# Patient Record
Sex: Male | Born: 1955
Health system: Southern US, Community
[De-identification: ages and names within clinical notes are randomized; demographics above are authoritative.]

## PROBLEM LIST (undated history)

## (undated) DIAGNOSIS — M1990 Unspecified osteoarthritis, unspecified site: Secondary | ICD-10-CM

## (undated) DIAGNOSIS — S8990XA Unspecified injury of unspecified lower leg, initial encounter: Secondary | ICD-10-CM

## (undated) DIAGNOSIS — C61 Malignant neoplasm of prostate: Secondary | ICD-10-CM

## (undated) DIAGNOSIS — I10 Essential (primary) hypertension: Secondary | ICD-10-CM

## (undated) DIAGNOSIS — D649 Anemia, unspecified: Secondary | ICD-10-CM

## (undated) DIAGNOSIS — N400 Enlarged prostate without lower urinary tract symptoms: Secondary | ICD-10-CM

## (undated) DIAGNOSIS — M549 Dorsalgia, unspecified: Secondary | ICD-10-CM

## (undated) DIAGNOSIS — T7840XA Allergy, unspecified, initial encounter: Secondary | ICD-10-CM

## (undated) HISTORY — DX: Allergy, unspecified, initial encounter: T78.40XA

## (undated) HISTORY — PX: PROSTATE BIOPSY: SHX241

## (undated) HISTORY — PX: NO PAST SURGERIES: SHX2092

## (undated) HISTORY — DX: Unspecified osteoarthritis, unspecified site: M19.90

## (undated) HISTORY — DX: Anemia, unspecified: D64.9

---

## 1999-01-08 ENCOUNTER — Emergency Department (HOSPITAL_COMMUNITY): Admission: EM | Admit: 1999-01-08 | Discharge: 1999-01-08 | Payer: Self-pay | Admitting: Emergency Medicine

## 2007-09-15 ENCOUNTER — Emergency Department (HOSPITAL_COMMUNITY): Admission: EM | Admit: 2007-09-15 | Discharge: 2007-09-16 | Payer: Self-pay | Admitting: Emergency Medicine

## 2007-09-21 ENCOUNTER — Emergency Department (HOSPITAL_COMMUNITY): Admission: EM | Admit: 2007-09-21 | Discharge: 2007-09-21 | Payer: Self-pay | Admitting: Emergency Medicine

## 2007-09-24 ENCOUNTER — Ambulatory Visit: Payer: Self-pay | Admitting: Nurse Practitioner

## 2007-09-24 ENCOUNTER — Ambulatory Visit: Payer: Self-pay | Admitting: *Deleted

## 2007-09-24 LAB — CONVERTED CEMR LAB
Benzodiazepines.: NEGATIVE
Cocaine Metabolites: NEGATIVE
Methadone: NEGATIVE
Opiate Screen, Urine: NEGATIVE
Propoxyphene: NEGATIVE

## 2007-10-21 ENCOUNTER — Ambulatory Visit: Payer: Self-pay | Admitting: Internal Medicine

## 2008-02-16 ENCOUNTER — Ambulatory Visit: Payer: Self-pay | Admitting: Internal Medicine

## 2008-07-20 ENCOUNTER — Ambulatory Visit: Payer: Self-pay | Admitting: Internal Medicine
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
## 2013-01-07 ENCOUNTER — Encounter (HOSPITAL_COMMUNITY): Payer: Self-pay | Admitting: Emergency Medicine

## 2013-01-07 ENCOUNTER — Emergency Department (HOSPITAL_COMMUNITY)
Admission: EM | Admit: 2013-01-07 | Discharge: 2013-01-07 | Disposition: A | Discharge status: Home Health | Payer: Medicare Other | Attending: Emergency Medicine | Admitting: Emergency Medicine

## 2013-01-07 DIAGNOSIS — Z87828 Personal history of other (healed) physical injury and trauma: Secondary | ICD-10-CM | POA: Insufficient documentation

## 2013-01-07 DIAGNOSIS — S0010XA Contusion of unspecified eyelid and periocular area, initial encounter: Secondary | ICD-10-CM | POA: Insufficient documentation

## 2013-01-07 DIAGNOSIS — R220 Localized swelling, mass and lump, head: Secondary | ICD-10-CM | POA: Insufficient documentation

## 2013-01-07 DIAGNOSIS — Y9389 Activity, other specified: Secondary | ICD-10-CM | POA: Insufficient documentation

## 2013-01-07 DIAGNOSIS — H5789 Other specified disorders of eye and adnexa: Secondary | ICD-10-CM | POA: Insufficient documentation

## 2013-01-07 DIAGNOSIS — X58XXXA Exposure to other specified factors, initial encounter: Secondary | ICD-10-CM | POA: Insufficient documentation

## 2013-01-07 DIAGNOSIS — H02849 Edema of unspecified eye, unspecified eyelid: Secondary | ICD-10-CM | POA: Insufficient documentation

## 2013-01-07 DIAGNOSIS — Y929 Unspecified place or not applicable: Secondary | ICD-10-CM | POA: Insufficient documentation

## 2013-01-07 HISTORY — DX: Unspecified injury of unspecified lower leg, initial encounter: S89.90XA

## 2013-01-07 MED ORDER — CIPROFLOXACIN HCL 0.3 % OP SOLN: 2.0000 [drp] | Freq: Once | OPHTHALMIC | Status: DC

## 2013-01-07 MED ORDER — CEPHALEXIN 500 MG PO CAPS: 500.0000 mg | ORAL_CAPSULE | Freq: Four times a day (QID) | ORAL | Status: DC

## 2013-01-07 MED ORDER — TETRACAINE HCL 0.5 % OP SOLN
2.0000 [drp] | Freq: Once | OPHTHALMIC | Status: DC
  Filled 2013-01-07: qty 2

## 2013-01-07 MED ORDER — FLUORESCEIN SODIUM 1 MG OP STRP
1.0000 | ORAL_STRIP | Freq: Once | OPHTHALMIC | Status: DC
  Filled 2013-01-07 (×4): qty 1

## 2013-01-07 NOTE — ED Provider Notes (Addendum)
57 year old male who states that he has had eye pain and swelling for the last 2 days after accidentally getting a hair dye into his eye. The swelling is persistent, left eye greater than right eye, no change in vision, no pain with extraocular movements, no eye pain complaints at all. His visual acuity is normal in the right eye but he has been unable to see out of the left eye because of swelling.  On exam the patient has normal appearing pupils bilaterally, conjunctival injection bilaterally, no obvious corneal abrasion to the right eye under fluorescein and tetracaine exam on slit lamp, left eye difficult to visualize secondary to significant lid edema and swelling. There is no redness to the lids, no tenderness to the lids, when the lids are retracted a very small amount of the conjunctiva and iris and pupil is seen which appears mildly inflamed.  The patient will require definitive ophthalmology followup, will treat with irrigation, check pH, abx drops and optho f/u - PA Mathis Fare will call to arrange.  Will treat for preseptal cellulitis though at this time pt has no redness or tenderness of lids, no fever and no pain with EOM.   Medical screening examination/treatment/procedure(s) were conducted as a shared visit with non-physician practitioner(s) and myself.  I personally evaluated the patient during the encounter    Vida Roller, MD 01/07/13 [id number]  Vida Roller, MD 01/08/13 0400
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
## 2013-01-07 NOTE — ED Notes (Signed)
Pt. Stated, I used some hair dye and I was washing it out and I think I got some of the stuff in my eye and I've washed it out with water and this am my eye was shut completely from swelling.

## 2013-01-08 NOTE — ED Provider Notes (Signed)
Medical screening examination/treatment/procedure(s) were conducted as a shared visit with non-physician practitioner(s) and myself.  I personally evaluated the patient during the encounter  Please see my separate respective documentation pertaining to this patient encounter   Vida Roller, MD 01/08/13 0400

## 2014-05-10 ENCOUNTER — Other Ambulatory Visit (HOSPITAL_COMMUNITY): Payer: Self-pay | Admitting: Urology

## 2014-05-10 DIAGNOSIS — C61 Malignant neoplasm of prostate: Secondary | ICD-10-CM

## 2014-05-24 ENCOUNTER — Encounter (HOSPITAL_COMMUNITY): Payer: Medicare Other

## 2014-05-24 ENCOUNTER — Ambulatory Visit (HOSPITAL_COMMUNITY)
Admission: RE | Admit: 2014-05-24 | Discharge: 2014-05-24 | Disposition: A | Discharge status: Home / Self Care | Payer: Medicare Other | Source: Ambulatory Visit | Attending: Urology | Admitting: Urology

## 2014-05-24 DIAGNOSIS — M47812 Spondylosis without myelopathy or radiculopathy, cervical region: Secondary | ICD-10-CM | POA: Diagnosis not present

## 2014-05-24 DIAGNOSIS — M25869 Other specified joint disorders, unspecified knee: Secondary | ICD-10-CM | POA: Diagnosis not present

## 2014-05-24 DIAGNOSIS — M25819 Other specified joint disorders, unspecified shoulder: Secondary | ICD-10-CM | POA: Insufficient documentation

## 2014-05-24 DIAGNOSIS — M545 Low back pain, unspecified: Secondary | ICD-10-CM | POA: Diagnosis not present

## 2014-05-24 DIAGNOSIS — C61 Malignant neoplasm of prostate: Secondary | ICD-10-CM | POA: Diagnosis present

## 2014-05-24 DIAGNOSIS — G8929 Other chronic pain: Secondary | ICD-10-CM | POA: Insufficient documentation

## 2014-05-24 DIAGNOSIS — M25569 Pain in unspecified knee: Secondary | ICD-10-CM | POA: Diagnosis not present

## 2014-05-24 MED ORDER — TECHNETIUM TC 99M MEDRONATE IV KIT
26.0000 | PACK | Freq: Once | INTRAVENOUS | Status: AC | PRN
  Administered 2014-05-24: 26 via INTRAVENOUS

## 2014-06-08 ENCOUNTER — Other Ambulatory Visit (HOSPITAL_COMMUNITY): Payer: Self-pay | Admitting: Urology

## 2014-06-08 DIAGNOSIS — R599 Enlarged lymph nodes, unspecified: Secondary | ICD-10-CM

## 2014-06-23 ENCOUNTER — Encounter (HOSPITAL_COMMUNITY): Payer: Self-pay | Admitting: Pharmacy Technician

## 2014-06-27 ENCOUNTER — Other Ambulatory Visit: Payer: Self-pay | Admitting: Radiology

## 2014-06-28 ENCOUNTER — Encounter (HOSPITAL_COMMUNITY): Payer: Self-pay

## 2014-06-28 ENCOUNTER — Ambulatory Visit (HOSPITAL_COMMUNITY)
Admission: RE | Admit: 2014-06-28 | Discharge: 2014-06-28 | Disposition: A | Discharge status: Home / Self Care | Payer: Medicare Other | Source: Ambulatory Visit | Attending: Urology | Admitting: Urology

## 2014-06-28 ENCOUNTER — Other Ambulatory Visit (HOSPITAL_COMMUNITY): Payer: Self-pay | Admitting: Urology

## 2014-06-28 DIAGNOSIS — R599 Enlarged lymph nodes, unspecified: Secondary | ICD-10-CM | POA: Diagnosis not present

## 2014-06-28 HISTORY — DX: Essential (primary) hypertension: I10

## 2014-06-28 HISTORY — DX: Dorsalgia, unspecified: M54.9

## 2014-06-28 HISTORY — DX: Benign prostatic hyperplasia without lower urinary tract symptoms: N40.0

## 2014-06-28 HISTORY — DX: Malignant neoplasm of prostate: C61

## 2014-06-28 LAB — CBC
HCT: 45.4 % (ref 39.0–52.0)
Hemoglobin: 15.5 g/dL (ref 13.0–17.0)
MCH: 30.3 pg (ref 26.0–34.0)
MCHC: 34.1 g/dL (ref 30.0–36.0)
MCV: 88.7 fL (ref 78.0–100.0)
PLATELETS: 215 10*3/uL (ref 150–400)
RBC: 5.12 MIL/uL (ref 4.22–5.81)
RDW: 12.1 % (ref 11.5–15.5)
WBC: 5.2 10*3/uL (ref 4.0–10.5)

## 2014-06-28 LAB — PROTIME-INR
INR: 0.99 (ref 0.00–1.49)
PROTHROMBIN TIME: 13.1 s (ref 11.6–15.2)

## 2014-06-28 LAB — APTT: APTT: 29 s (ref 24–37)

## 2014-06-28 MED ORDER — MIDAZOLAM HCL 2 MG/2ML IJ SOLN
INTRAMUSCULAR | Status: AC
  Filled 2014-06-28: qty 6

## 2014-06-28 MED ORDER — FENTANYL CITRATE 0.05 MG/ML IJ SOLN
INTRAMUSCULAR | Status: AC
  Filled 2014-06-28: qty 6

## 2014-06-28 MED ORDER — SODIUM CHLORIDE 0.9 % IV SOLN
Freq: Once | INTRAVENOUS | Status: AC
  Administered 2014-06-28: 12:00:00 via INTRAVENOUS

## 2014-06-28 NOTE — Progress Notes (Signed)
Pt returned from ultrasound and Teena Dunk states the doctor stated there was nothing to biopsy and procedure was canceled.  Pt did not receive any IV sedation or any drugs via IV and the doctor states to d/c his IV and send him home. Misty informed pt that he should be hearing from Dr.Nesi for further followup.  Pt voiced understanding.

## 2014-06-28 NOTE — Progress Notes (Signed)
Pt iv site d/c, pressure dressing applied.  Pt vss, afebrile.  No sedation was given.  Pt d/c home ambulatory with friend.

## 2014-06-28 NOTE — H&P (Signed)
Chief Complaint: "I'm here for a biopsy" Referring Physician:Nesi HPI: Terry Hinton is an 58 y.o. male with recently diagnosed prostate cancer. He is found to have pelvic and inguinal lymphadenopathy on recent CT and is referred for US guided biopsy. PMHx, meds, labs, imaging reviewed.  Past Medical History:  Past Medical History  Diagnosis Date  . Leg injury   . Hypertension   . Back pain     back, neck and leg problems, pt. stated  . Enlarged prostate     pt. stated  . Prostate cancer     Past Surgical History:  Past Surgical History  Procedure Laterality Date  . No past surgeries      Family History: History reviewed. No pertinent family history.  Social History:  reports that he quit smoking about 7 weeks ago. He does not have any smokeless tobacco history on file. He reports that he drinks alcohol. He reports that he does not use illicit drugs.  Allergies: No Known Allergies  Medications:   Medication List    ASK your doctor about these medications       lisinopril-hydrochlorothiazide 20-12.5 MG per tablet  Commonly known as:  PRINZIDE,ZESTORETIC  Take 1 tablet by mouth every morning.     methocarbamol 500 MG tablet  Commonly known as:  ROBAXIN  Take 500 mg by mouth every 6 (six) hours as needed for muscle spasms.     tamsulosin 0.4 MG Caps capsule  Commonly known as:  FLOMAX  Take 0.4 mg by mouth at bedtime.     traMADol 50 MG tablet  Commonly known as:  ULTRAM  Take 50 mg by mouth every 6 (six) hours as needed (Pain).        Please HPI for pertinent positives, otherwise complete 10 system ROS negative.  Physical Exam: BP 136/89  Pulse 83  Temp(Src) 98.3 F (36.8 C) (Oral)  Resp 20  Ht 6\' 8"  (2.032 m)  Wt 210 lb (95.255 kg)  BMI 23.07 kg/m2  SpO2 98% Body mass index is 23.07 kg/(m^2).   General Appearance:  Alert, cooperative, no distress, appears stated age  Head:  Normocephalic, without obvious abnormality, atraumatic  ENT: Unremarkable   Neck: Supple, symmetrical, trachea midline  Lungs:   Clear to auscultation bilaterally, no w/r/r, respirations unlabored without use of accessory muscles.  Heart:  Regular rate and rhythm, S1, S2 normal, no murmur, rub or gallop.  Abdomen:   Soft, non-tender, non distended. Could not palpate any inguinal adenopathy  Neurologic: Normal affect, no gross deficits.   Results for orders placed during the hospital encounter of 06/28/14 (from the past 48 hour(s))  APTT     Status: None   Collection Time    06/28/14 11:10 AM      Result Value Ref Range   aPTT 29  24 - 37 seconds  CBC     Status: None   Collection Time    06/28/14 11:10 AM      Result Value Ref Range   WBC 5.2  4.0 - 10.5 K/uL   RBC 5.12  4.22 - 5.81 MIL/uL   Hemoglobin 15.5  13.0 - 17.0 g/dL   HCT 45.4  39.0 - 52.0 %   MCV 88.7  78.0 - 100.0 fL   MCH 30.3  26.0 - 34.0 pg   MCHC 34.1  30.0 - 36.0 g/dL   RDW 12.1  11.5 - 15.5 %   Platelets 215  150 - 400 K/uL  PROTIME-INR  Status: None   Collection Time    06/28/14 11:10 AM      Result Value Ref Range   Prothrombin Time 13.1  11.6 - 15.2 seconds   INR 0.99  0.00 - 1.49   No results found.  Assessment/Plan Prostate cancer with pelvic and inguinal lymphadenopathy by CT For US guided biopsy of (L)inguinal LN Explained procedure, risks, complications, use of sedation Labs reviewed. Consent signed in chart  Ascencion Dike PA-C 06/28/2014, 12:33 PM

## 2014-07-07 ENCOUNTER — Telehealth: Payer: Self-pay | Admitting: *Deleted

## 2014-07-07 NOTE — Telephone Encounter (Signed)
Called patient re: his referral to the 07/15/14 Prostate [facility].  He stated "I'm good, don't need it, I won't be coming."  I asked if his MD was aware of his decision.  I believe he said Dr. Janice Norrie was his physician but before I could confirm, he hung up.  I am notifying Dr. Alinda Money of patient's decision.  Gayleen Orem, RN, BSN, [facility] [place] [phone]

## 2014-07-15 ENCOUNTER — Ambulatory Visit: Admission: RE | Admit: 2014-07-15 | Payer: Medicare Other | Source: Ambulatory Visit | Admitting: Radiation Oncology

## 2016-01-11 ENCOUNTER — Other Ambulatory Visit: Payer: Self-pay | Admitting: Urology

## 2016-01-11 DIAGNOSIS — C61 Malignant neoplasm of prostate: Secondary | ICD-10-CM

## 2016-02-01 ENCOUNTER — Ambulatory Visit (HOSPITAL_COMMUNITY)
Admission: RE | Admit: 2016-02-01 | Discharge: 2016-02-01 | Disposition: A | Discharge status: Home / Self Care | Payer: Medicare Other | Source: Ambulatory Visit | Attending: Urology | Admitting: Urology

## 2016-02-01 ENCOUNTER — Encounter (HOSPITAL_COMMUNITY)
Admission: RE | Admit: 2016-02-01 | Discharge: 2016-02-01 | Disposition: A | Discharge status: Home / Self Care | Payer: Medicare Other | Source: Ambulatory Visit | Attending: Urology | Admitting: Urology

## 2016-02-01 DIAGNOSIS — C61 Malignant neoplasm of prostate: Secondary | ICD-10-CM | POA: Diagnosis not present

## 2016-02-01 MED ORDER — TECHNETIUM TC 99M MEDRONATE IV KIT
25.8000 | PACK | Freq: Once | INTRAVENOUS | Status: AC | PRN
  Administered 2016-02-01: 25.8 via INTRAVENOUS

## 2016-03-19 ENCOUNTER — Encounter: Payer: Self-pay | Admitting: Radiation Oncology

## 2016-03-19 NOTE — Progress Notes (Signed)
GU Location of Tumor / Histology: prostatic adenocarcinoma  If Prostate Cancer, Gleason Score is (4 + 5) and PSA is (1214) as of 01/11/16  Terry Hinton was last seen by Dr. Alyson Ingles in August 2015 and at that time his PSA was 474.   Biopsies of prostate (if applicable) revealed:    Past/Anticipated interventions by urology, if any: Firmagon 120 mg x 2 given on 03/18/16 plus referral to radiation oncology for condieration of IMRT. Per McKenzie's note the patient will require ADT for 2-3 years.  Past/Anticipated interventions by medical oncology, if any: no  Weight changes, if any: no  Bowel/Bladder complaints, if any: urinary frequency, urgency and nocturia x 2-3   Nausea/Vomiting, if any: no  Pain issues, if any:  no  SAFETY ISSUES:  Prior radiation? no  Pacemaker/ICD? no  Possible current pregnancy? no  Is the patient on methotrexate? no  Current Complaints / other details:  60 year old male. Single. CT abd/pelvis revealed retroperitoneal lymph nodes. Bone scan normal.

## 2016-03-21 ENCOUNTER — Ambulatory Visit
Admission: RE | Admit: 2016-03-21 | Discharge: 2016-03-21 | Disposition: A | Discharge status: Home / Self Care | Payer: Medicare Other | Source: Ambulatory Visit | Attending: Radiation Oncology | Admitting: Radiation Oncology

## 2016-03-21 ENCOUNTER — Ambulatory Visit: Payer: Medicare Other

## 2016-03-22 ENCOUNTER — Ambulatory Visit: Payer: Medicare Other

## 2016-03-22 ENCOUNTER — Ambulatory Visit: Payer: Medicare Other | Admitting: Radiation Oncology

## 2016-03-22 ENCOUNTER — Inpatient Hospital Stay: Admission: RE | Admit: 2016-03-22 | Payer: Medicare Other | Source: Ambulatory Visit

## 2016-05-01 ENCOUNTER — Telehealth: Payer: Self-pay | Admitting: Medical Oncology

## 2016-05-01 NOTE — Telephone Encounter (Signed)
I called Terry Hinton to inquire about the cancelled appointments with Dr. Tammi Klippel. He cancelled  his appointment 5/18 and  rescheduled for  5/19 and again he cancelled. He states that he took the hormone shot and it made him so fatigued he stopped. I explained that he has some enlarged lymph nodes that need to be treated. I stressed to him the importance of getting the radiation to  keep the cancer from spreading. I offered to transfer him to [place]  to reschedule but he declined and stated that he would call me back. I will notify Sam, Dr. Johny Shears nurse.

## 2016-09-24 ENCOUNTER — Encounter: Payer: Self-pay | Admitting: Gastroenterology

## 2016-11-13 ENCOUNTER — Encounter: Payer: Self-pay | Admitting: Internal Medicine

## 2016-11-28 ENCOUNTER — Encounter: Payer: Medicare Other | Admitting: Gastroenterology

## 2017-12-11 ENCOUNTER — Encounter: Payer: Self-pay | Admitting: Internal Medicine

## 2017-12-17 ENCOUNTER — Other Ambulatory Visit: Payer: Self-pay | Admitting: Urology

## 2017-12-17 ENCOUNTER — Emergency Department (HOSPITAL_COMMUNITY)
Admission: EM | Admit: 2017-12-17 | Discharge: 2017-12-17 | Disposition: A | Discharge status: Home / Self Care | Payer: Medicare Other | Attending: Emergency Medicine | Admitting: Emergency Medicine

## 2017-12-17 ENCOUNTER — Other Ambulatory Visit: Payer: Self-pay

## 2017-12-17 ENCOUNTER — Encounter (HOSPITAL_COMMUNITY): Payer: Self-pay | Admitting: Emergency Medicine

## 2017-12-17 DIAGNOSIS — R110 Nausea: Secondary | ICD-10-CM | POA: Insufficient documentation

## 2017-12-17 DIAGNOSIS — Z87891 Personal history of nicotine dependence: Secondary | ICD-10-CM | POA: Diagnosis not present

## 2017-12-17 DIAGNOSIS — R339 Retention of urine, unspecified: Secondary | ICD-10-CM | POA: Insufficient documentation

## 2017-12-17 DIAGNOSIS — R1030 Lower abdominal pain, unspecified: Secondary | ICD-10-CM | POA: Diagnosis present

## 2017-12-17 DIAGNOSIS — I10 Essential (primary) hypertension: Secondary | ICD-10-CM | POA: Insufficient documentation

## 2017-12-17 DIAGNOSIS — Z79899 Other long term (current) drug therapy: Secondary | ICD-10-CM | POA: Diagnosis not present

## 2017-12-17 DIAGNOSIS — Z8546 Personal history of malignant neoplasm of prostate: Secondary | ICD-10-CM | POA: Diagnosis not present

## 2017-12-17 DIAGNOSIS — R338 Other retention of urine: Secondary | ICD-10-CM

## 2017-12-17 DIAGNOSIS — C61 Malignant neoplasm of prostate: Secondary | ICD-10-CM

## 2017-12-17 LAB — CBC WITH DIFFERENTIAL/PLATELET
Basophils Absolute: 0 10*3/uL (ref 0.0–0.1)
Basophils Relative: 0 %
EOS ABS: 0.2 10*3/uL (ref 0.0–0.7)
Eosinophils Relative: 2 %
HCT: 42.9 % (ref 39.0–52.0)
HEMOGLOBIN: 14.4 g/dL (ref 13.0–17.0)
Lymphocytes Relative: 11 %
Lymphs Abs: 0.8 10*3/uL (ref 0.7–4.0)
MCH: 30.7 pg (ref 26.0–34.0)
MCHC: 33.6 g/dL (ref 30.0–36.0)
MCV: 91.5 fL (ref 78.0–100.0)
Monocytes Absolute: 0.4 10*3/uL (ref 0.1–1.0)
Monocytes Relative: 5 %
NEUTROS PCT: 82 %
Neutro Abs: 5.9 10*3/uL (ref 1.7–7.7)
PLATELETS: 181 10*3/uL (ref 150–400)
RBC: 4.69 MIL/uL (ref 4.22–5.81)
RDW: 13.4 % (ref 11.5–15.5)
WBC: 7.3 10*3/uL (ref 4.0–10.5)

## 2017-12-17 LAB — BASIC METABOLIC PANEL
Anion gap: 14 (ref 5–15)
BUN: 14 mg/dL (ref 6–20)
CALCIUM: 9.4 mg/dL (ref 8.9–10.3)
CO2: 21 mmol/L — ABNORMAL LOW (ref 22–32)
CREATININE: 1.06 mg/dL (ref 0.61–1.24)
Chloride: 106 mmol/L (ref 101–111)
Glucose, Bld: 143 mg/dL — ABNORMAL HIGH (ref 65–99)
Potassium: 3.7 mmol/L (ref 3.5–5.1)
Sodium: 141 mmol/L (ref 135–145)

## 2017-12-17 LAB — URINALYSIS, ROUTINE W REFLEX MICROSCOPIC
Bacteria, UA: NONE SEEN
Bilirubin Urine: NEGATIVE
GLUCOSE, UA: NEGATIVE mg/dL
Ketones, ur: NEGATIVE mg/dL
Leukocytes, UA: NEGATIVE
NITRITE: NEGATIVE
Protein, ur: NEGATIVE mg/dL
SPECIFIC GRAVITY, URINE: 1.011 (ref 1.005–1.030)
Squamous Epithelial / LPF: NONE SEEN
pH: 6 (ref 5.0–8.0)

## 2017-12-17 NOTE — ED Triage Notes (Signed)
Pt arriving from home with complaints of abdominal pain in lower quadrants. Pt states he ate a burger for dinner and he feels that it just didn't agree with him. Pt states he feels bloated in the lower quadrants. Pt also having pain in his "backside". Pt denies hemorrhoids. Pt feeling nauseous but denies vomiting.  18g R hand 121mcg Fentanyl

## 2017-12-17 NOTE — ED Notes (Signed)
Bed: EV20 Expected date:  Expected time:  Means of arrival:  Comments: EMS 62 yo male with swelling to rectum/testicles

## 2017-12-17 NOTE — ED Notes (Signed)
Bladder scan shows pt bladder is full. Will in and out pt if necessary to relieve.

## 2017-12-17 NOTE — ED Provider Notes (Signed)
[facility] DEPT Provider Note: Georgena Spurling, MD, FACEP  CSN: [id number] MRN: [id number] ARRIVAL: 12/17/17 at [facility]: [place]  Urinary Retention   HISTORY OF PRESENT ILLNESS  12/17/17 3:48 AM Terry Hinton is a 62 y.o. male with a history of prostate cancer currently undergoing radiation treatment.  He is here with lower abdominal pain that began yesterday after eating dinner.  He describes the pain as crampy and moderate to severe.  It was associated with inability to urinate.  On arrival a bladder scan showed greater than 700 mL of retained urine in the bladder and a Foley catheter was placed by nursing staff.  He had significant relief of his symptoms.  He has had associated nausea but no vomiting.  He was given 100 mcg of fentanyl prior to arrival with partial relief of his pain.   Past Medical History:  Diagnosis Date  . Back pain    back, neck and leg problems, pt. stated  . Enlarged prostate    pt. stated  . Hypertension   . Leg injury   . Prostate cancer [facility])     Past Surgical History:  Procedure Laterality Date  . NO PAST SURGERIES    . PROSTATE BIOPSY      No family history on file.  Social History   Tobacco Use  . Smoking status: Former Smoker    Types: Cigarettes    Last attempt to quit: 05/08/2014    Years since quitting: 3.6  . Smokeless tobacco: Never Used  Substance Use Topics  . Alcohol use: Yes  . Drug use: No    Prior to Admission medications   Medication Sig Start Date End Date Taking? Authorizing Provider  cholecalciferol (VITAMIN D) 1000 units tablet Take 1,000 Units by mouth daily.   Yes [provider]  cyclobenzaprine (FLEXERIL) 5 MG tablet Take 5 mg by mouth 3 (three) times daily as needed for muscle spasms.   Yes [provider]  finasteride (PROSCAR) 5 MG tablet Take 5 mg by mouth daily.   Yes [provider]  latanoprost (XALATAN) 0.005 % ophthalmic solution Place 1 drop into both  eyes at bedtime.   Yes [provider]  OVER THE COUNTER MEDICATION Take 1 tablet by mouth daily.   Yes [provider]  traMADol (ULTRAM) 50 MG tablet Take 50 mg by mouth every 6 (six) hours as needed (Pain).   Yes [provider]    Allergies Patient has no known allergies.   REVIEW OF SYSTEMS  Negative except as noted here or in the History of Present Illness.   PHYSICAL EXAMINATION  Initial Vital Signs Blood pressure (!) 164/96, pulse 84, temperature 97.8 F (36.6 C), temperature source Oral, resp. rate 20, SpO2 93 %.  Examination General: Well-developed, well-nourished male in no acute distress; appearance consistent with age of record HENT: normocephalic; atraumatic Eyes: pupils equal, round and reactive to light; extraocular muscles intact Neck: supple Heart: regular rate and rhythm Lungs: clear to auscultation bilaterally Abdomen: soft; nondistended; nontender; no masses or hepatosplenomegaly; bowel sounds present GU: Foley catheter draining clear amber urine Extremities: No deformity; full range of motion; pulses normal Neurologic: Awake, alert and oriented; motor function intact in all extremities and symmetric; no facial droop Skin: Warm and dry Psychiatric: Normal mood and affect   RESULTS  Summary of this visit's results, reviewed by myself:   EKG Interpretation  Date/Time:    Ventricular Rate:    PR Interval:  QRS Duration:   QT Interval:    QTC Calculation:   R Axis:     Text Interpretation:        Laboratory Studies: Results for orders placed or performed during the hospital encounter of 12/17/17 (from the past 24 hour(s))  CBC with Differential     Status: None   Collection Time: 12/17/17  3:07 AM  Result Value Ref Range   WBC 7.3 4.0 - 10.5 K/uL   RBC 4.69 4.22 - 5.81 MIL/uL   Hemoglobin 14.4 13.0 - 17.0 g/dL   HCT 42.9 39.0 - 52.0 %   MCV 91.5 78.0 - 100.0 fL   MCH 30.7 26.0 - 34.0 pg   MCHC 33.6 30.0 - 36.0  g/dL   RDW 13.4 11.5 - 15.5 %   Platelets 181 150 - 400 K/uL   Neutrophils Relative % 82 %   Neutro Abs 5.9 1.7 - 7.7 K/uL   Lymphocytes Relative 11 %   Lymphs Abs 0.8 0.7 - 4.0 K/uL   Monocytes Relative 5 %   Monocytes Absolute 0.4 0.1 - 1.0 K/uL   Eosinophils Relative 2 %   Eosinophils Absolute 0.2 0.0 - 0.7 K/uL   Basophils Relative 0 %   Basophils Absolute 0.0 0.0 - 0.1 K/uL  Basic metabolic panel     Status: Abnormal   Collection Time: 12/17/17  3:07 AM  Result Value Ref Range   Sodium 141 135 - 145 mmol/L   Potassium 3.7 3.5 - 5.1 mmol/L   Chloride 106 101 - 111 mmol/L   CO2 21 (L) 22 - 32 mmol/L   Glucose, Bld 143 (H) 65 - 99 mg/dL   BUN 14 6 - 20 mg/dL   Creatinine, Ser 1.06 0.61 - 1.24 mg/dL   Calcium 9.4 8.9 - 10.3 mg/dL   GFR calc non Af Amer >60 >60 mL/min   GFR calc Af Amer >60 >60 mL/min   Anion gap 14 5 - 15  Urinalysis, Routine w reflex microscopic     Status: Abnormal   Collection Time: 12/17/17  3:49 AM  Result Value Ref Range   Color, Urine YELLOW YELLOW   APPearance CLEAR CLEAR   Specific Gravity, Urine 1.011 1.005 - 1.030   pH 6.0 5.0 - 8.0   Glucose, UA NEGATIVE NEGATIVE mg/dL   Hgb urine dipstick MODERATE (A) NEGATIVE   Bilirubin Urine NEGATIVE NEGATIVE   Ketones, ur NEGATIVE NEGATIVE mg/dL   Protein, ur NEGATIVE NEGATIVE mg/dL   Nitrite NEGATIVE NEGATIVE   Leukocytes, UA NEGATIVE NEGATIVE   RBC / HPF 6-30 0 - 5 RBC/hpf   WBC, UA 0-5 0 - 5 WBC/hpf   Bacteria, UA NONE SEEN NONE SEEN   Squamous Epithelial / LPF NONE SEEN NONE SEEN   Mucus PRESENT    Imaging Studies: No results found.  ED COURSE  Nursing notes and initial vitals signs, including pulse oximetry, reviewed.  Vitals:   12/17/17 0312 12/17/17 0322  BP: (!) 164/96   Pulse: 84   Resp: 20   Temp:  97.8 F (36.6 C)  TempSrc:  Oral  SpO2: 93%     PROCEDURES    ED DIAGNOSES     ICD-10-CM   1. Acute urinary retention R33.8        Shanon Rosser, MD 12/17/17 [phone]

## 2017-12-18 LAB — URINE CULTURE: Culture: NO GROWTH

## 2017-12-23 ENCOUNTER — Encounter: Payer: Self-pay | Admitting: Oncology

## 2017-12-25 ENCOUNTER — Encounter (HOSPITAL_COMMUNITY)
Admission: RE | Admit: 2017-12-25 | Discharge: 2017-12-25 | Disposition: A | Discharge status: Home / Self Care | Payer: Medicare Other | Source: Ambulatory Visit | Attending: Urology | Admitting: Urology

## 2017-12-25 DIAGNOSIS — C61 Malignant neoplasm of prostate: Secondary | ICD-10-CM | POA: Insufficient documentation

## 2017-12-25 MED ORDER — TECHNETIUM TC 99M MEDRONATE IV KIT
21.4000 | PACK | Freq: Once | INTRAVENOUS | Status: AC | PRN
  Administered 2017-12-25: 21.4 via INTRAVENOUS

## 2018-01-14 ENCOUNTER — Telehealth: Payer: Self-pay

## 2018-01-14 ENCOUNTER — Inpatient Hospital Stay: Payer: Medicare Other | Attending: Oncology | Admitting: Oncology

## 2018-01-14 ENCOUNTER — Inpatient Hospital Stay: Payer: Medicare Other

## 2018-01-14 VITALS — BP 137/66 | HR 92 | Temp 95.9°F | Resp 13 | Wt 275.0 lb

## 2018-01-14 DIAGNOSIS — Z87891 Personal history of nicotine dependence: Secondary | ICD-10-CM | POA: Diagnosis not present

## 2018-01-14 DIAGNOSIS — C7951 Secondary malignant neoplasm of bone: Secondary | ICD-10-CM | POA: Diagnosis not present

## 2018-01-14 DIAGNOSIS — C61 Malignant neoplasm of prostate: Secondary | ICD-10-CM

## 2018-01-14 LAB — CMP (CANCER CENTER ONLY)
ALK PHOS: 137 U/L (ref 40–150)
ALT: 34 U/L (ref 0–55)
AST: 20 U/L (ref 5–34)
Albumin: 3.9 g/dL (ref 3.5–5.0)
Anion gap: 8 (ref 3–11)
BILIRUBIN TOTAL: 0.2 mg/dL (ref 0.2–1.2)
BUN: 14 mg/dL (ref 7–26)
CO2: 27 mmol/L (ref 22–29)
CREATININE: 1.15 mg/dL (ref 0.70–1.30)
Calcium: 9.4 mg/dL (ref 8.4–10.4)
Chloride: 106 mmol/L (ref 98–109)
GFR, Est AFR Am: >60 mL/min (ref >60)
Glucose, Bld: 106 mg/dL (ref 70–140)
POTASSIUM: 3.8 mmol/L (ref 3.5–5.1)
Sodium: 141 mmol/L (ref 136–145)
TOTAL PROTEIN: 6.6 g/dL (ref 6.4–8.3)

## 2018-01-14 LAB — CBC WITH DIFFERENTIAL (CANCER CENTER ONLY)
Basophils Absolute: 0 10*3/uL (ref 0.0–0.1)
Basophils Relative: 1 %
Eosinophils Absolute: 0.4 10*3/uL (ref 0.0–0.5)
Eosinophils Relative: 6 %
HEMATOCRIT: 44 % (ref 38.4–49.9)
HEMOGLOBIN: 14.3 g/dL (ref 13.0–17.1)
LYMPHS ABS: 1.5 10*3/uL (ref 0.9–3.3)
Lymphocytes Relative: 25 %
MCH: 29.8 pg (ref 27.2–33.4)
MCHC: 32.4 g/dL (ref 32.0–36.0)
MCV: 92 fL (ref 79.3–98.0)
Monocytes Absolute: 0.4 10*3/uL (ref 0.1–0.9)
Monocytes Relative: 7 %
NEUTROS ABS: 3.8 10*3/uL (ref 1.5–6.5)
Neutrophils Relative %: 61 %
Platelet Count: 211 10*3/uL (ref 140–400)
RBC: 4.79 MIL/uL (ref 4.20–5.82)
RDW: 13.3 % (ref 11.0–14.6)
WBC Count: 6.1 10*3/uL (ref 4.0–10.3)

## 2018-01-14 MED ORDER — PREDNISONE 5 MG PO TABS: 5.0000 mg | ORAL_TABLET | Freq: Every day | ORAL | 3 refills | Status: AC

## 2018-01-14 MED ORDER — ABIRATERONE ACETATE 250 MG PO TABS: 1000.0000 mg | ORAL_TABLET | Freq: Every day | ORAL | 0 refills | Status: DC

## 2018-01-14 NOTE — Telephone Encounter (Signed)
Printed avs and calender of upcoming appointm,ent. 3/13

## 2018-01-14 NOTE — Telephone Encounter (Signed)
Printed avs and calender of upcoming appointment. Per 3/13 los Also, he Lab add on for today

## 2018-01-14 NOTE — Progress Notes (Signed)
Reason for Referral: Prostate cancer.   HPI: 62 year old gentleman currently of [place] where he lived the majority of his life. He is a gentleman with history of hypertension without any other comorbid conditions. He was found to have an elevated PSA in 2015 of 474. Prostate biopsy at that time showed Mrs. score 4+5 = 9 and 4 cores and 4+4 = 8 and the rest of the cores with high-volume disease. Staging workup at that time did not show any bony metastasis but he did have an enlarged left external iliac and left inguinal lymph nodes. At that time, he received Firmagon at 240 mg and was scheduled to start radiation therapy but canceled those appointments and felt fatigued on ADT and discontinued therapy at that time. He failed to follow-up since that time.  He was seen in the emergency department on 12/17/2017 and at that time he was having difficulties with voiding and obstructive symptoms. A catheter was placed at the time and was referred back to Dr. Alyson Ingles. He was started on Flomax and his PSA at that time was 2078. Bone scan obtained on 12/25/2017 showed new focus of abnormal uptake in the proximal left humeral shaft concerning for metastatic disease. CT scan of the abdomen and pelvis showed a large irregular prostate mass and appears to be invading into the bladder mass and causing bladder outlet obstruction and bladder wall thickening. No metastasis was noted in the external iliac and bilaterally. Based on these findings, he was started on Firmagon by Dr. Alyson Ingles. Since that time, he noticed improvement in his symptoms overall. He is able to urinate freely without any catheter at this time. He denies any hematuria or dysuria. He denied any abdominal pain or distention. He denied any difficulties with defecation. His performance status and quality of life is improved.  He does not report any headaches, blurry vision, syncope or seizures. Does not report any fevers, chills or sweats.  Does not  report any cough, wheezing or hemoptysis.  Does not report any chest pain, palpitation, orthopnea or leg edema.  Does not report any nausea, vomiting or abdominal pain.  Does not report any constipation or diarrhea.  Does not report any skeletal complaints.    Does not report frequency, urgency or hematuria.  Does not report any skin rashes or lesions. Does not report any heat or cold intolerance.  Does not report any lymphadenopathy or petechiae.  Does not report any anxiety or depression.  Remaining review of systems is negative.    Past Medical History:  Diagnosis Date  . Back pain    back, neck and leg problems, pt. stated  . Enlarged prostate    pt. stated  . Hypertension   . Leg injury   . Prostate cancer [facility])   :  Past Surgical History:  Procedure Laterality Date  . NO PAST SURGERIES    . PROSTATE BIOPSY    :   Current Outpatient Medications:  .  abiraterone acetate (ZYTIGA) 250 MG tablet, Take 4 tablets (1,000 mg total) by mouth daily. Take on an empty stomach 1 hour before or 2 hours after a meal, Disp: 120 tablet, Rfl: 0 .  cholecalciferol (VITAMIN D) 1000 units tablet, Take 1,000 Units by mouth daily., Disp: , Rfl:  .  cyclobenzaprine (FLEXERIL) 5 MG tablet, Take 5 mg by mouth 3 (three) times daily as needed for muscle spasms., Disp: , Rfl:  .  finasteride (PROSCAR) 5 MG tablet, Take 5 mg by mouth daily., Disp: , Rfl:  .  latanoprost (XALATAN) 0.005 % ophthalmic solution, Place 1 drop into both eyes at bedtime., Disp: , Rfl:  .  OVER THE COUNTER MEDICATION, Take 1 tablet by mouth daily., Disp: , Rfl:  .  predniSONE (DELTASONE) 5 MG tablet, Take 1 tablet (5 mg total) by mouth daily with breakfast., Disp: 90 tablet, Rfl: 3 .  traMADol (ULTRAM) 50 MG tablet, Take 50 mg by mouth every 6 (six) hours as needed (Pain)., Disp: , Rfl: :  No Known Allergies:  No family history on file.:  Social History   Socioeconomic History  . Marital status: Widowed    Spouse name: Not on  file  . Number of children: Not on file  . Years of education: Not on file  . Highest education level: Not on file  Social Needs  . Financial resource strain: Not on file  . Food insecurity - worry: Not on file  . Food insecurity - inability: Not on file  . Transportation needs - medical: Not on file  . Transportation needs - non-medical: Not on file  Occupational History  . Not on file  Tobacco Use  . Smoking status: Former Smoker    Types: Cigarettes    Last attempt to quit: 05/08/2014    Years since quitting: 3.6  . Smokeless tobacco: Never Used  Substance and Sexual Activity  . Alcohol use: Yes  . Drug use: No  . Sexual activity: Not Currently  Other Topics Concern  . Not on file  Social History Narrative  . Not on file  :    Exam: Blood pressure 137/66, pulse 92, temperature (!) 95.9 F (35.5 C), temperature source Oral, resp. rate 13, weight 275 lb (124.7 kg), SpO2 97 %.  ECOG 0 General appearance: alert and cooperative appeared without distress. Head: atraumatic without any abnormalities. Eyes: conjunctivae/corneas clear. PERRL.  Sclera anicteric. Throat: lips, mucosa, and tongue normal; without oral thrush or ulcers. Resp: clear to auscultation bilaterally without rhonchi, wheezes or dullness to percussion. Cardio: regular rate and rhythm, S1, S2 normal, no murmur, click, rub or gallop GI: soft, non-tender; bowel sounds normal; no masses,  no organomegaly Skin: Skin color, texture, turgor normal. No rashes or lesions Lymph nodes: Cervical, supraclavicular, and axillary nodes normal. Neurologic: Grossly normal without any motor, sensory or deep tendon reflexes. Musculoskeletal: No joint deformity or effusion.  CBC    Component Value Date/Time   WBC 7.3 12/17/2017 0307   RBC 4.69 12/17/2017 0307   HGB 14.4 12/17/2017 0307   HCT 42.9 12/17/2017 0307   PLT 181 12/17/2017 0307   MCV 91.5 12/17/2017 0307   MCH 30.7 12/17/2017 0307   MCHC 33.6 12/17/2017 0307    RDW 13.4 12/17/2017 0307   LYMPHSABS 0.8 12/17/2017 0307   MONOABS 0.4 12/17/2017 0307   EOSABS 0.2 12/17/2017 0307   BASOSABS 0.0 12/17/2017 0307     Chemistry      Component Value Date/Time   NA 141 12/17/2017 0307   K 3.7 12/17/2017 0307   CL 106 12/17/2017 0307   CO2 21 (L) 12/17/2017 0307   BUN 14 12/17/2017 0307   CREATININE 1.06 12/17/2017 0307      Component Value Date/Time   CALCIUM 9.4 12/17/2017 0307       Nm Bone Scan Whole Body  Result Date: 12/25/2017 CLINICAL DATA:  Current history of prostate cancer. EXAM: NUCLEAR MEDICINE WHOLE BODY BONE SCAN TECHNIQUE: Whole body anterior and posterior images were obtained approximately 3 hours after intravenous injection of radiopharmaceutical. RADIOPHARMACEUTICALS:  21.4 mCi Technetium-22m MDP IV COMPARISON:  Bone scans of February 01, 2016 and May 24, 2014. FINDINGS: New focus of abnormal uptake is seen involving the proximal left humeral shaft concerning for metastatic disease. Stable abnormal uptake is seen involving the left knee consistent with degenerative change. Abnormal uptake is seen involving both feet also consistent with degenerative change. Focus of abnormal uptake is seen to the right of lower cervical spine most consistent with degenerative change. Stable focus of abnormal uptake is seen involving midthoracic spine most consistent with degenerative change. IMPRESSION: New focus of abnormal uptake is seen involving the proximal left humeral shaft concerning for metastatic disease. Electronically Signed   By: Marijo Conception, M.D.   On: 12/25/2017 16:21    Assessment and Plan:   62 year old gentleman with the following issues:  1. Advanced prostate cancer diagnosed in 2015. At that time his PSA was 474 and a Gleason score of 4+5 = 9 with high-volume disease. He has pelvic lymphadenopathy of the time of diagnosis. He received androgen deprivation therapy for a short period of time and discontinue treatment altogether  between 2015 and 2019 and failed to follow-up. He presented with obstructive symptoms in 2019 with a PSA of 2000 and local advanced disease and pelvic adenopathy. He has been restarted on ADT at this time.  The natural course of hormone sensitive advanced prostate cancer was discussed today with the patient in detail. He has an incurable malignancy that has been left untreated for close to 4 years now but he is willing to proceed with treatment at this time. Treatment would be palliative but would be reasonably effective to control his disease for a period of time. The essential treatment would be androgen deprivation therapy which he has started under the care of Dr. Alyson Ingles and will continue to receive that indefinitely.  The role of additional therapy in the form of Zytiga or systemic chemotherapy was discussed today. The rationale for using those incisions in the hormone sensitive prostate cancer was discussed. Complications associated with Zytiga and prednisone was reviewed which include fatigue, hypokalemia, hypertension, renal insufficiency among others. After discussion today, he is agreeable to proceed with adding Zytiga to hormone therapy.  2. Androgen deprivation therapy: I recommended continuing this indefinitely.  3. Local symptoms of pain and obstructive uropathy: Improved with systemic therapy. The rationale for using local radiation was discussed today which will be predominantly to control local symptoms. After discussion today, he would like to defer that option unless needed in the future.  4. Bone directed therapy: He will be a good candidate for Xgeva after obtaining dental clearance which will be discussed in the future.  5. Prognosis: He continues to be an excellent health and performance status and aggressive therapy is warranted. Although his disease is incurable but could be evaluated for an extended time.  6. Follow-up: Will be next 6 weeks to follow his progress.  60   minutes was spent with the patient face-to-face today.  More than 50% of time was dedicated to patient counseling, education and coordination of  his multifaceted care.

## 2018-01-15 ENCOUNTER — Telehealth: Payer: Self-pay | Admitting: Pharmacy Technician

## 2018-01-15 LAB — PROSTATE-SPECIFIC AG, SERUM (LABCORP): Prostate Specific Ag, Serum: 576.8 ng/mL — ABNORMAL HIGH (ref 0.0–4.0)

## 2018-01-15 NOTE — Telephone Encounter (Signed)
Oral Oncology Patient Advocate Encounter  Prior Authorization for Abiraterone has been approved.    PA# [id number] Effective dates: 01/15/2018 through 11/03/2018  Oral Oncology Clinic will continue to follow.   Fabio Asa. Melynda Keller, [place] Patient [place] [phone] 01/15/2018 12:42 PM

## 2018-01-15 NOTE — Telephone Encounter (Signed)
Oral Oncology Patient Advocate Encounter  Received notification from [facility] Part D that prior authorization for Terry Hinton is required.  PA submitted on CoverMyMeds Key [id number] Status is pending  Oral Oncology Clinic will continue to follow.  Terry Hinton. Melynda Keller, [place] Patient Friend [phone] 01/15/2018 12:34 PM

## 2018-01-16 ENCOUNTER — Telehealth: Payer: Self-pay | Admitting: Pharmacist

## 2018-01-16 ENCOUNTER — Encounter: Payer: Self-pay | Admitting: *Deleted

## 2018-01-16 DIAGNOSIS — C61 Malignant neoplasm of prostate: Secondary | ICD-10-CM

## 2018-01-16 MED ORDER — ABIRATERONE ACETATE 250 MG PO TABS: 1000.0000 mg | ORAL_TABLET | Freq: Every day | ORAL | 0 refills | Status: DC

## 2018-01-16 NOTE — Telephone Encounter (Signed)
Oral Oncology Pharmacist Encounter  Received new prescription for Zytiga (abiraterone) for the treatment of metastatic, castration sensitive prostate cancer in conjunction with ADT administered at [facility], planned duration until disease progression or unacceptable toxicity.  Labs from 01/14/2018 assessed, okay for treatment.  Current medication list in Epic reviewed, no significant DDIs with Zytiga identified.  Prescription has been e-scribed to the [facility] for benefits analysis and approval. Prior authorization has been submitted to patient's insurance and has been approved. Test claim of the pharmacy revealed copayment $1.25.  Oral Oncology Clinic will continue to follow for initial counseling and start date.  Johny Drilling, PharmD, BCPS, BCOP 01/16/2018 1:12 PM Oral Oncology Clinic [phone]

## 2018-01-16 NOTE — Telephone Encounter (Signed)
Oral Chemotherapy Pharmacist Encounter   Attempted to reach patient to provide update and offer for initial counseling on oral medication: Zytiga.   No answer at either provided number in chart (mobile and home number). No voicemail picked up at either number so unable to leave message.  I called the emergency contact number provided, it is NOT the phone number of patient's sister. I have reached out to collaborative practice RNs to see about getting this number removed from patient's chart.  Oncology clinic will try to reach out to patient to provide initial counseling and information about medication dispensing.  Thank you,  Johny Drilling, PharmD, BCPS, BCOP 01/16/2018   1:29 PM Oral Oncology Clinic [phone]

## 2018-01-19 ENCOUNTER — Telehealth: Payer: Self-pay | Admitting: Pharmacy Technician

## 2018-01-19 MED FILL — ABIRATERONE ACETATE 250 MG: 250 | 30 days supply | Qty: 120 | Fill #0

## 2018-01-19 NOTE — Telephone Encounter (Signed)
Oral Chemotherapy Pharmacist Encounter   I spoke with patient for overview of: Zytiga.   Counseled patient on administration, dosing, side effects, monitoring, drug-food interactions, safe handling, storage, and disposal.  Patient will take Zytiga 250mg  tablets, 4 tablets (1000mg ) by mouth once daily on an empty stomach, 1 hour before or 2 hours after a meal. Patient states he will take his Zyitga first thing in the morning and will wait at least 1 hour before eating. Patient will take prednisone 5mg  tablet, 1 tablet by mouth one daily with breakfast. Zytiga start date: 01/20/2018  Side effects include but not limited to: peripheral edema, GI upset, hypertension, hot flashes, fatigue, and arthralgias.    Reviewed with patient importance of keeping a medication schedule and plan for any missed doses.  Mr. Parcel voiced understanding and appreciation.   All questions answered. Medication reconciliation performed and medication/allergy list updated.  Generic abiraterone will be ready for patient to pick up today, 01/19/2018, from the less than long outpatient pharmacy for copayment $1.25 for 1 month supply. Patient will pick up his abiraterone today. He will start tomorrow when he wakes up.  Patient knows to call the office with questions or concerns.  Oral Oncology Clinic will continue to follow.  Thank you,  Johny Drilling, PharmD, BCPS, BCOP 01/19/2018 2:46 PM Oral Oncology Clinic [phone]

## 2018-01-19 NOTE — Telephone Encounter (Signed)
Oral Oncology Patient Advocate Encounter  Received a call from Terry Hinton Patient Support stating that a new application for assistance was needed for Mr. Terry Hinton to continue to receive Revlimid.  Patint noted to still be uninsured for prescription drugs.  Completed application submitted via [employer]'s online application process.    [employer] patient assistance phone number for follow up is [phone].   This encounter will be updated until final determination.   Terry Hinton. Terry Hinton, [place] [facility] [phone] 01/19/2018 2:48 PM

## 2018-01-21 NOTE — Telephone Encounter (Signed)
Oral Oncology Patient Advocate Encounter  disregard message below.    Terry Hinton. Terry Hinton, Terry Hinton Patient Terry Hinton [phone] 01/21/2018 9:52 AM

## 2018-02-13 ENCOUNTER — Other Ambulatory Visit: Payer: Self-pay | Admitting: Oncology

## 2018-02-13 DIAGNOSIS — C61 Malignant neoplasm of prostate: Secondary | ICD-10-CM

## 2018-02-13 MED FILL — ABIRATERONE ACETATE 250 MG: 250 | 30 days supply | Qty: 120 | Fill #0

## 2018-02-25 ENCOUNTER — Telehealth: Payer: Self-pay | Admitting: Oncology

## 2018-02-25 ENCOUNTER — Inpatient Hospital Stay: Payer: Medicare Other | Attending: Oncology | Admitting: Oncology

## 2018-02-25 ENCOUNTER — Inpatient Hospital Stay: Payer: Medicare Other

## 2018-02-25 VITALS — BP 139/94 | HR 82 | Temp 98.5°F | Resp 13 | Ht >= 80 in | Wt 280.8 lb

## 2018-02-25 DIAGNOSIS — Z79899 Other long term (current) drug therapy: Secondary | ICD-10-CM | POA: Insufficient documentation

## 2018-02-25 DIAGNOSIS — C61 Malignant neoplasm of prostate: Secondary | ICD-10-CM | POA: Insufficient documentation

## 2018-02-25 DIAGNOSIS — C7951 Secondary malignant neoplasm of bone: Secondary | ICD-10-CM | POA: Diagnosis not present

## 2018-02-25 LAB — CBC WITH DIFFERENTIAL (CANCER CENTER ONLY)
BASOS PCT: 1 %
Basophils Absolute: 0 10*3/uL (ref 0.0–0.1)
EOS ABS: 0.7 10*3/uL — AB (ref 0.0–0.5)
EOS PCT: 13 %
HCT: 44.8 % (ref 38.4–49.9)
HEMOGLOBIN: 14.7 g/dL (ref 13.0–17.1)
LYMPHS ABS: 2.1 10*3/uL (ref 0.9–3.3)
Lymphocytes Relative: 42 %
MCH: 29.6 pg (ref 27.2–33.4)
MCHC: 32.7 g/dL (ref 32.0–36.0)
MCV: 90.5 fL (ref 79.3–98.0)
MONO ABS: 0.6 10*3/uL (ref 0.1–0.9)
MONOS PCT: 11 %
Neutro Abs: 1.6 10*3/uL (ref 1.5–6.5)
Neutrophils Relative %: 33 %
Platelet Count: 189 10*3/uL (ref 140–400)
RBC: 4.94 MIL/uL (ref 4.20–5.82)
RDW: 12.9 % (ref 11.0–14.6)
WBC Count: 5 10*3/uL (ref 4.0–10.3)

## 2018-02-25 LAB — CMP (CANCER CENTER ONLY)
ALBUMIN: 4.2 g/dL (ref 3.5–5.0)
ALK PHOS: 131 U/L (ref 40–150)
ALT: 18 U/L (ref 0–55)
ANION GAP: 0 — AB (ref 3–11)
AST: 17 U/L (ref 5–34)
BUN: 14 mg/dL (ref 7–26)
CALCIUM: 9.8 mg/dL (ref 8.4–10.4)
CO2: 34 mmol/L — AB (ref 22–29)
Chloride: 109 mmol/L (ref 98–109)
Creatinine: 1.09 mg/dL (ref 0.70–1.30)
GFR, Estimated: >60 mL/min (ref >60)
Glucose, Bld: 98 mg/dL (ref 70–140)
Potassium: 4.1 mmol/L (ref 3.5–5.1)
SODIUM: 143 mmol/L (ref 136–145)
Total Bilirubin: 0.6 mg/dL (ref 0.2–1.2)
Total Protein: 6.8 g/dL (ref 6.4–8.3)

## 2018-02-25 NOTE — Progress Notes (Signed)
Hematology and Oncology Follow Up Visit  Terry Hinton [id number] 11-Aug-1956 62 y.o. 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  His PSA was 2078.  His initial Gleason score was 4+5 = 9.   Prior Therapy:   Firmagon 240 mg given in 2015.  Patient failed to follow-up after that.  Current therapy:  Androgen deprivation therapy restarted by Dr. Alyson Ingles in February 2019.  Zytiga 1000 mg daily with prednisone 5 mg daily started in March 2019.  Interim History: Terry Hinton presents today for a follow-up visit.  Since the last visit, he started taking Zytiga and has felt really well since the last visit.  He reports no complications related to this medication including no edema, bone pain or excessive fatigue.  He denied any nausea or changes in his bowel habits.  He is urinating freely without any hematuria or dysuria.  He does not report any headaches, blurry vision, syncope or seizures. Does not report any fevers, chills or sweats.  Does not report any cough, wheezing or hemoptysis.  Does not report any chest pain, palpitation, orthopnea or leg edema.  Does not report any nausea, vomiting or abdominal pain.  Does not report any constipation or diarrhea.  Does not report any skeletal complaints.    Does not report frequency, urgency or hematuria.  Does not report any skin rashes or lesions. Does not report any heat or cold intolerance.  Does not report any lymphadenopathy or petechiae.  Does not report any anxiety or depression.  Remaining review of systems is negative.    Medications: I have reviewed the patient's current medications.  Current Outpatient Medications  Medication Sig Dispense Refill  . abiraterone acetate (ZYTIGA) 250 MG tablet TAKE 4 TABLETS (1,000 MG TOTAL) BY MOUTH DAILY. TAKE ON AN EMPTY  STOMACH 1 HOUR BEFORE OR 2 HOURS AFTER A MEAL 120 tablet 0  . cholecalciferol (VITAMIN D) 1000 units tablet Take 1,000 Units by mouth daily.    . cyclobenzaprine (FLEXERIL) 5 MG tablet Take 5 mg by mouth 3 (three) times daily as needed for muscle spasms.    . finasteride (PROSCAR) 5 MG tablet Take 5 mg by mouth daily.    [name] latanoprost (XALATAN) 0.005 % ophthalmic solution Place 1 drop into both eyes at bedtime.    [name] OVER THE COUNTER MEDICATION Take 1 tablet by mouth daily.    . predniSONE (DELTASONE) 5 MG tablet Take 1 tablet (5 mg total) by mouth daily with breakfast. 90 tablet 3  . tamsulosin (FLOMAX) 0.4 MG CAPS capsule   3  . traMADol (ULTRAM) 50 MG tablet Take 50 mg by mouth every 6 (six) hours as needed (Pain).     No current facility-administered medications for this visit.      Allergies: No Known Allergies  Past Medical History, Surgical history, Social history, and Family History were reviewed and updated.  Review of Systems:  Remaining ROS negative.  Physical Exam: Blood pressure (!) 139/94, pulse 82, temperature 98.5 F (36.9 C), temperature source Oral, resp. rate 13, height 6\' 8"  (2.032 m), weight 280 lb 12.8 oz (127.4 kg), SpO2 96 %. ECOG: 1 General appearance: alert and cooperative appeared without distress. Head: Normocephalic, without obvious abnormality Oropharynx: No oral thrush or ulcers. Eyes: No scleral icterus.  Pupils are equal and round reactive to light. Lymph nodes: Cervical, supraclavicular,  and axillary nodes normal. Heart:regular rate and rhythm, S1, S2 normal, no murmur, click, rub or gallop Lung:chest clear, no wheezing, rales, normal symmetric air entry Abdomin: soft, non-tender, without masses or organomegaly. Neurological: No motor, sensory deficits.  Intact deep tendon reflexes. Skin: No rashes or lesions.  No ecchymosis or petechiae. Musculoskeletal: No joint deformity or effusion. Psychiatric: Mood and affect are appropriate.    Lab  Results: Lab Results  Component Value Date   WBC 5.0 02/25/2018   HGB 14.7 02/25/2018   HCT 44.8 02/25/2018   MCV 90.5 02/25/2018   PLT 189 02/25/2018     Chemistry      Component Value Date/Time   NA 141 01/14/2018 1448   K 3.8 01/14/2018 1448   CL 106 01/14/2018 1448   CO2 27 01/14/2018 1448   BUN 14 01/14/2018 1448   CREATININE 1.15 01/14/2018 1448      Component Value Date/Time   CALCIUM 9.4 01/14/2018 1448   ALKPHOS 137 01/14/2018 1448   AST 20 01/14/2018 1448   ALT 34 01/14/2018 1448   BILITOT 0.2 01/14/2018 1448      Assessment and plan:  62 year old gentleman with the following issues:  1.  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  He presented with pelvic lymphadenopathy and bone metastasis documented in February 2019.    He started [place] in March 2019 and tolerated it well.  He was taking a total of 1000 mg daily in split doses.  I recommended taking all 4 tablets of 250 mg on an empty stomach at one time.  Risks and benefits of continuing this medication was discussed today and is agreeable to continue.  We will continue to monitor his PSA periodically.  2. Androgen deprivation therapy: I advised continuing this therapy under the care of Dr. Alyson Ingles indefinitely.  3. 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  4. Bone directed therapy: We will continue to address dental clearance with him for initiating Xgeva in the future.  5. Prognosis: His performance status is excellent and aggressive therapy is warranted.  He does have an incurable malignancy however.  6. Follow-up: Will be next 6 weeks to follow his progress.  15  minutes was spent with the patient face-to-face today.  More than 50% of time was dedicated to patient counseling, education and coordination of his care.     Zola Button, MD 4/24/20192:32 PM

## 2018-02-25 NOTE — Telephone Encounter (Signed)
Scheduled appt per 4/24 los - Gave patient aVS and calender per los.  

## 2018-02-26 LAB — PROSTATE-SPECIFIC AG, SERUM (LABCORP): Prostate Specific Ag, Serum: 1.8 ng/mL (ref 0.0–4.0)

## 2018-03-16 ENCOUNTER — Other Ambulatory Visit: Payer: Self-pay | Admitting: Oncology

## 2018-03-16 DIAGNOSIS — C61 Malignant neoplasm of prostate: Secondary | ICD-10-CM

## 2018-03-16 MED FILL — ABIRATERONE ACETATE 250 MG: 250 | 30 days supply | Qty: 120 | Fill #0

## 2018-04-10 ENCOUNTER — Other Ambulatory Visit: Payer: Self-pay | Admitting: Oncology

## 2018-04-10 DIAGNOSIS — C61 Malignant neoplasm of prostate: Secondary | ICD-10-CM

## 2018-04-10 MED FILL — ABIRATERONE ACETATE 250 MG: 250 | 30 days supply | Qty: 120 | Fill #0

## 2018-04-21 ENCOUNTER — Inpatient Hospital Stay: Payer: Medicare Other | Attending: Oncology | Admitting: Oncology

## 2018-04-21 ENCOUNTER — Inpatient Hospital Stay: Payer: Medicare Other

## 2018-04-21 ENCOUNTER — Telehealth: Payer: Self-pay | Admitting: Oncology

## 2018-04-21 VITALS — BP 155/98 | HR 78 | Temp 100.0°F | Resp 18 | Ht >= 80 in | Wt 276.4 lb

## 2018-04-21 DIAGNOSIS — C7951 Secondary malignant neoplasm of bone: Secondary | ICD-10-CM | POA: Diagnosis present

## 2018-04-21 DIAGNOSIS — Z79899 Other long term (current) drug therapy: Secondary | ICD-10-CM | POA: Diagnosis not present

## 2018-04-21 DIAGNOSIS — C61 Malignant neoplasm of prostate: Secondary | ICD-10-CM | POA: Diagnosis present

## 2018-04-21 LAB — CBC WITH DIFFERENTIAL (CANCER CENTER ONLY)
BASOS ABS: 0 10*3/uL (ref 0.0–0.1)
BASOS PCT: 1 %
EOS ABS: 1.4 10*3/uL — AB (ref 0.0–0.5)
Eosinophils Relative: 24 %
HEMATOCRIT: 42.4 % (ref 38.4–49.9)
HEMOGLOBIN: 14 g/dL (ref 13.0–17.1)
Lymphocytes Relative: 32 %
Lymphs Abs: 1.9 10*3/uL (ref 0.9–3.3)
MCH: 29.7 pg (ref 27.2–33.4)
MCHC: 32.9 g/dL (ref 32.0–36.0)
MCV: 90.3 fL (ref 79.3–98.0)
Monocytes Absolute: 0.5 10*3/uL (ref 0.1–0.9)
Monocytes Relative: 8 %
NEUTROS ABS: 2.1 10*3/uL (ref 1.5–6.5)
NEUTROS PCT: 35 %
Platelet Count: 205 10*3/uL (ref 140–400)
RBC: 4.7 MIL/uL (ref 4.20–5.82)
RDW: 13.5 % (ref 11.0–14.6)
WBC: 5.9 10*3/uL (ref 4.0–10.3)

## 2018-04-21 LAB — CMP (CANCER CENTER ONLY)
ALK PHOS: 155 U/L — AB (ref 40–150)
ALT: 11 U/L (ref 0–55)
ANION GAP: 8 (ref 3–11)
AST: 13 U/L (ref 5–34)
Albumin: 4.2 g/dL (ref 3.5–5.0)
BILIRUBIN TOTAL: 0.4 mg/dL (ref 0.2–1.2)
BUN: 9 mg/dL (ref 7–26)
CO2: 27 mmol/L (ref 22–29)
CREATININE: 0.94 mg/dL (ref 0.70–1.30)
Calcium: 9.7 mg/dL (ref 8.4–10.4)
Chloride: 109 mmol/L (ref 98–109)
Glucose, Bld: 104 mg/dL (ref 70–140)
Potassium: 3.8 mmol/L (ref 3.5–5.1)
Sodium: 144 mmol/L (ref 136–145)
TOTAL PROTEIN: 6.8 g/dL (ref 6.4–8.3)

## 2018-04-21 NOTE — Telephone Encounter (Signed)
Gave pt avs and calendar with appts per 6/18

## 2018-04-21 NOTE — Progress Notes (Signed)
Hematology and Oncology Follow Up Visit  Terry Hinton [id number] 1956-10-05 62 y.o. 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  He presented with with PSA was 2078, Gleason score was 4+5 = 9.  He has documented disease to the bone and lymphadenopathy.   Prior Therapy:   Firmagon 240 mg given in 2015.  Patient failed to follow-up after that.  Current therapy:  Androgen deprivation therapy restarted by Dr. Alyson Hinton in February 2019.  Zytiga 1000 mg daily with prednisone 5 mg daily started in March 2019.  Interim History: Terry Hinton is here for a follow-up visit.  Since the last visit, he reports no complications related to [facility].  He denies any nausea, fatigue or excessive tiredness.  He denies any lower extremity edema or change in his bowel habits.  He continues to take the full dose of Zytiga with prednisone without missing many doses.  He reports reasonable quality of life and performance status.  He continues to receive Lupron under the care of Dr. Alyson Hinton  He does not report any headaches, blurry vision, syncope or seizures.  He denies any alteration in mental status or depression.  Does not report any fevers, chills or sweats.  Does not report any cough, wheezing or hemoptysis.  Does not report any chest pain, palpitation, orthopnea or leg edema.  Does not report any nausea, vomiting or abdominal pain.    He denies any hematochezia or melena.  He denies any bone pain or pathological fractures.   Does not report frequency, urgency or hematuria.  Does not report any skin rashes or lesions.  Does not report any lymphadenopathy or petechiae.  Does not report any anxiety or depression.  Remaining review of systems is negative.    Medications: I have reviewed the patient's current medications.  Current Outpatient Medications  Medication Sig Dispense Refill  . abiraterone  acetate (ZYTIGA) 250 MG tablet TAKE 4 TABLETS (1,000 MG TOTAL) BY MOUTH DAILY. TAKE ON AN EMPTY STOMACH 1 HOUR BEFORE OR 2 HOURS AFTER A MEAL 120 tablet 0  . cholecalciferol (VITAMIN D) 1000 units tablet Take 1,000 Units by mouth daily.    . cyclobenzaprine (FLEXERIL) 5 MG tablet Take 5 mg by mouth 3 (three) times daily as needed for muscle spasms.    . finasteride (PROSCAR) 5 MG tablet Take 5 mg by mouth daily.    [name] latanoprost (XALATAN) 0.005 % ophthalmic solution Place 1 drop into both eyes at bedtime.    [name] OVER THE COUNTER MEDICATION Take 1 tablet by mouth daily.    . predniSONE (DELTASONE) 5 MG tablet Take 1 tablet (5 mg total) by mouth daily with breakfast. 90 tablet 3  . tamsulosin (FLOMAX) 0.4 MG CAPS capsule   3  . traMADol (ULTRAM) 50 MG tablet Take 50 mg by mouth every 6 (six) hours as needed (Pain).     No current facility-administered medications for this visit.      Allergies: No Known Allergies  Past Medical History, Surgical history, Social history, and Family History were reviewed and updated.  Review of Systems:  Remaining ROS negative.  Physical Exam: Blood pressure (!) 155/98, pulse 78, temperature 100 F (37.8 C), temperature source Oral, resp. rate 18, height 6\' 8"  (2.032 m), weight 276 lb 6.4 oz (125.4 kg), SpO2 99 %. ECOG: 1 General appearance: Comfortable appearing gentleman without distress. Head: Atraumatic without abnormalities. Oropharynx: Membranes are moist and  pink.. Eyes: Conjunctiva is clear with intact extraocular muscles. Lymph nodes: No lymphadenopathy noted in the cervical, supraclavicular, or axillary nodes  Heart:regular rate and rhythm, without any murmurs or gallops. Lung: Clear to auscultation without any rhonchi or wheezes or dullness to percussion. Abdomin: soft, without any rebound or guarding.  No shifting dullness or ascites. Neurological: No deficits noted any motor or sensory exam. Skin: No petechia or ecchymosis. Musculoskeletal:  No clubbing or cyanosis.     Lab Results: Lab Results  Component Value Date   WBC 5.9 04/21/2018   HGB 14.0 04/21/2018   HCT 42.4 04/21/2018   MCV 90.3 04/21/2018   PLT 205 04/21/2018     Chemistry      Component Value Date/Time   NA 143 02/25/2018 1404   K 4.1 02/25/2018 1404   CL 109 02/25/2018 1404   CO2 34 (H) 02/25/2018 1404   BUN 14 02/25/2018 1404   CREATININE 1.09 02/25/2018 1404      Component Value Date/Time   CALCIUM 9.8 02/25/2018 1404   ALKPHOS 131 02/25/2018 1404   AST 17 02/25/2018 1404   ALT 18 02/25/2018 1404   BILITOT 0.6 02/25/2018 1404      Results for Terry, Hinton (MRN [id number]) as of 04/21/2018 14:41  Ref. Range 01/14/2018 14:48 02/25/2018 14:04  Prostate Specific Ag, Serum Latest Ref Range: 0.0 - 4.0 ng/mL 576.8 (H) 1.8    Assessment and plan:  62 year old gentleman with the following issues:  1.  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  He has documented lymphadenopathy and bony metastasis.   He remains on Zytiga with prednisone with excellent PSA response.  He denies any complications related to this medication.  Risks and benefits of continuing this medication the same dose and schedule reviewed today is agreeable to continue..  2. Androgen deprivation therapy: He is receiving Lupron under the care of Dr. Alyson Hinton.  I have recommended continuing this indefinitely.  3. Obstructive uropathy: He is urinating freely at this time without any complications.  4. Bone directed therapy: He will be a good candidate for bone directed therapy.  We will continue to address that with him if not started by Dr. Alyson Hinton.  5. Prognosis: He has an incurable malignancy but disease that can be palliated.  His performance status remain excellent.  6. Follow-up: Will be next 4 months to follow his progress..  15  minutes was spent with the patient face-to-face today.  More than 50% of time was dedicated to patient counseling,  education and addressing questions regarding diagnosis, prognosis and future plan of care.     Terry Button, MD 6/18/20192:47 PM

## 2018-04-22 LAB — PROSTATE-SPECIFIC AG, SERUM (LABCORP): PROSTATE SPECIFIC AG, SERUM: 0.2 ng/mL (ref 0.0–4.0)

## 2018-05-04 ENCOUNTER — Other Ambulatory Visit: Payer: Self-pay | Admitting: Oncology

## 2018-05-04 DIAGNOSIS — C61 Malignant neoplasm of prostate: Secondary | ICD-10-CM

## 2018-05-05 MED FILL — ABIRATERONE ACETATE 250 MG: 250 | 30 days supply | Qty: 120 | Fill #0

## 2018-06-02 ENCOUNTER — Other Ambulatory Visit: Payer: Self-pay | Admitting: Oncology

## 2018-06-02 DIAGNOSIS — C61 Malignant neoplasm of prostate: Secondary | ICD-10-CM

## 2018-06-02 MED FILL — ABIRATERONE ACETATE 250 MG: 250 | 30 days supply | Qty: 120 | Fill #0

## 2018-06-23 ENCOUNTER — Other Ambulatory Visit: Payer: Self-pay | Admitting: Oncology

## 2018-06-23 DIAGNOSIS — C61 Malignant neoplasm of prostate: Secondary | ICD-10-CM

## 2018-06-29 MED FILL — ABIRATERONE ACETATE 250 MG: 250 | 30 days supply | Qty: 120 | Fill #0

## 2018-07-21 ENCOUNTER — Other Ambulatory Visit: Payer: Self-pay | Admitting: Oncology

## 2018-07-21 DIAGNOSIS — C61 Malignant neoplasm of prostate: Secondary | ICD-10-CM

## 2018-07-27 MED FILL — ABIRATERONE ACETATE 250 MG: 250 | 30 days supply | Qty: 120 | Fill #0

## 2018-08-21 ENCOUNTER — Other Ambulatory Visit: Payer: Self-pay

## 2018-08-21 ENCOUNTER — Telehealth: Payer: Self-pay | Admitting: Oncology

## 2018-08-21 ENCOUNTER — Inpatient Hospital Stay: Payer: Medicare Other | Attending: Oncology | Admitting: Oncology

## 2018-08-21 ENCOUNTER — Inpatient Hospital Stay: Payer: Medicare Other

## 2018-08-21 VITALS — BP 148/90 | HR 89 | Temp 98.6°F | Resp 18 | Ht >= 80 in | Wt 281.0 lb

## 2018-08-21 DIAGNOSIS — E348 Other specified endocrine disorders: Secondary | ICD-10-CM | POA: Diagnosis not present

## 2018-08-21 DIAGNOSIS — Z7952 Long term (current) use of systemic steroids: Secondary | ICD-10-CM

## 2018-08-21 DIAGNOSIS — C779 Secondary and unspecified malignant neoplasm of lymph node, unspecified: Secondary | ICD-10-CM | POA: Diagnosis not present

## 2018-08-21 DIAGNOSIS — C61 Malignant neoplasm of prostate: Secondary | ICD-10-CM | POA: Diagnosis present

## 2018-08-21 DIAGNOSIS — C7951 Secondary malignant neoplasm of bone: Secondary | ICD-10-CM

## 2018-08-21 DIAGNOSIS — Z79899 Other long term (current) drug therapy: Secondary | ICD-10-CM

## 2018-08-21 LAB — CMP (CANCER CENTER ONLY)
ALT: 13 U/L (ref 0–44)
AST: 14 U/L — AB (ref 15–41)
Albumin: 3.9 g/dL (ref 3.5–5.0)
Alkaline Phosphatase: 163 U/L — ABNORMAL HIGH (ref 38–126)
Anion gap: 11 (ref 5–15)
BILIRUBIN TOTAL: 0.6 mg/dL (ref 0.3–1.2)
BUN: 7 mg/dL — AB (ref 8–23)
CO2: 26 mmol/L (ref 22–32)
CREATININE: 0.9 mg/dL (ref 0.61–1.24)
Calcium: 9.3 mg/dL (ref 8.9–10.3)
Chloride: 106 mmol/L (ref 98–111)
Glucose, Bld: 162 mg/dL — ABNORMAL HIGH (ref 70–99)
Potassium: 3 mmol/L — CL (ref 3.5–5.1)
Sodium: 143 mmol/L (ref 135–145)
TOTAL PROTEIN: 6.6 g/dL (ref 6.5–8.1)

## 2018-08-21 LAB — CBC WITH DIFFERENTIAL (CANCER CENTER ONLY)
ABS IMMATURE GRANULOCYTES: 0.02 10*3/uL (ref 0.00–0.07)
Basophils Absolute: 0 10*3/uL (ref 0.0–0.1)
Basophils Relative: 1 %
EOS ABS: 0.5 10*3/uL (ref 0.0–0.5)
Eosinophils Relative: 9 %
HEMATOCRIT: 39.9 % (ref 39.0–52.0)
Hemoglobin: 13.4 g/dL (ref 13.0–17.0)
IMMATURE GRANULOCYTES: 0 %
LYMPHS ABS: 2.1 10*3/uL (ref 0.7–4.0)
Lymphocytes Relative: 34 %
MCH: 30.5 pg (ref 26.0–34.0)
MCHC: 33.6 g/dL (ref 30.0–36.0)
MCV: 90.7 fL (ref 80.0–100.0)
MONO ABS: 0.4 10*3/uL (ref 0.1–1.0)
MONOS PCT: 7 %
NEUTROS PCT: 49 %
Neutro Abs: 3 10*3/uL (ref 1.7–7.7)
Platelet Count: 189 10*3/uL (ref 150–400)
RBC: 4.4 MIL/uL (ref 4.22–5.81)
RDW: 12.6 % (ref 11.5–15.5)
WBC Count: 6.1 10*3/uL (ref 4.0–10.5)
nRBC: 0 % (ref 0.0–0.2)

## 2018-08-21 MED ORDER — POTASSIUM CHLORIDE CRYS ER 20 MEQ PO TBCR: 20.0000 meq | EXTENDED_RELEASE_TABLET | Freq: Every day | ORAL | 0 refills | Status: AC

## 2018-08-21 NOTE — Progress Notes (Signed)
Hematology and Oncology Follow Up Visit  Tayden Gagen [id number] December 28, 1955 62 y.o. 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  He presented with with PSA was 2078, Gleason score was 4+5 = 9.     Prior Therapy:   Firmagon 240 mg given in 2015.  Patient failed to follow-up after that.  Current therapy:  Androgen deprivation therapy restarted by Dr. Alyson Ingles in February 2019.  Zytiga 1000 mg daily with prednisone 5 mg daily started in March 2019.  Interim History: Mr. Terry Hinton returns today for repeat evaluation.  Since last visit, he reports no major changes in his health.  He continues to notice significant improvement in his performance status and activity level.  His appetite and activity levels remain excellent.  He denies any flank pain or bone pain.  He denies any urination difficulties.  He denies any decline in his energy.  He tolerates Zytiga without any new complaints.  He does not report any headaches, blurry vision, syncope or seizures.  He denies any confusion or lethargy.  Does not report any fevers, chills or sweats.  Does not report any cough, wheezing or hemoptysis.  Does not report any chest pain, palpitation, orthopnea or leg edema.  Does not report any nausea, vomiting or abdominal pain.    He denies any changes in his bowel habits.  He denies any paralysis or myalgias.   Does not report frequency, urgency or hematuria.  Does not report any skin rashes or lesions.  Does not report any lymphadenopathy or petechiae.  Denies any bleeding or clotting tendency.  Does not report any injuries in his mood.  Remaining review of systems is negative.    Medications: I have reviewed the patient's current medications.  Current Outpatient Medications  Medication Sig Dispense Refill  . abiraterone acetate (ZYTIGA)  250 MG tablet TAKE 4 TABLETS (1,000 MG TOTAL) BY MOUTH DAILY. TAKE ON AN EMPTY STOMACH 1 HOUR BEFORE OR 2 HOURS AFTER A MEAL 120 tablet 0  . cholecalciferol (VITAMIN D) 1000 units tablet Take 1,000 Units by mouth daily.    . cyclobenzaprine (FLEXERIL) 5 MG tablet Take 5 mg by mouth 3 (three) times daily as needed for muscle spasms.    . finasteride (PROSCAR) 5 MG tablet Take 5 mg by mouth daily.    [name] latanoprost (XALATAN) 0.005 % ophthalmic solution Place 1 drop into both eyes at bedtime.    [name] OVER THE COUNTER MEDICATION Take 1 tablet by mouth daily.    . predniSONE (DELTASONE) 5 MG tablet Take 1 tablet (5 mg total) by mouth daily with breakfast. 90 tablet 3  . tamsulosin (FLOMAX) 0.4 MG CAPS capsule   3  . traMADol (ULTRAM) 50 MG tablet Take 50 mg by mouth every 6 (six) hours as needed (Pain).     No current facility-administered medications for this visit.      Allergies: No Known Allergies  Past Medical History, Surgical history, Social history, and Family History were reviewed and updated.  Review of Systems:  Remaining ROS negative.  Physical Exam: Blood pressure (!) 148/90, pulse 89, temperature 98.6 F (37 C), temperature source Oral, resp. rate 18, height 6\' 8"  (2.032 m), weight 281 lb (127.5 kg), SpO2 100 %. ECOG: 1   General appearance: Alert, awake without any distress. Head: Atraumatic without abnormalities Oropharynx: Without any thrush or ulcers. Eyes: No  scleral icterus. Lymph nodes: No lymphadenopathy noted in the cervical, supraclavicular, or axillary nodes Heart:regular rate and rhythm, without any murmurs or gallops.   Lung: Clear to auscultation without any rhonchi, wheezes or dullness to percussion. Abdomin: Soft, nontender without any shifting dullness or ascites. Musculoskeletal: No clubbing or cyanosis. Neurological: No motor or sensory deficits. Skin: No rashes or lesions. Psychiatric: Mood and affect appeared normal.  .     Lab Results: Lab  Results  Component Value Date   WBC 6.1 08/21/2018   HGB 13.4 08/21/2018   HCT 39.9 08/21/2018   MCV 90.7 08/21/2018   PLT 189 08/21/2018     Chemistry      Component Value Date/Time   NA 144 04/21/2018 1408   K 3.8 04/21/2018 1408   CL 109 04/21/2018 1408   CO2 27 04/21/2018 1408   BUN 9 04/21/2018 1408   CREATININE 0.94 04/21/2018 1408      Component Value Date/Time   CALCIUM 9.7 04/21/2018 1408   ALKPHOS 155 (H) 04/21/2018 1408   AST 13 04/21/2018 1408   ALT 11 04/21/2018 1408   BILITOT 0.4 04/21/2018 1408      Results for JAYZEN, PAVER (MRN [id number]) as of 08/21/2018 14:03  Ref. Range 02/25/2018 14:04 04/21/2018 14:08  Prostate Specific Ag, Serum Latest Ref Range: 0.0 - 4.0 ng/mL 1.8 0.2     Assessment and plan:  62 year old gentleman with the following issues:  1.  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  He is currently on Zytiga and has tolerated therapy very well.  His PSA continues to show excellent response and declines appropriately.  Risks and benefits of continuing this therapy in addition to androgen deprivation was reviewed.  Long-term complication associated with this medication was also discussed.  These complications include hypertension, adrenal insufficiency among others.  He is agreeable to continue at this time.  2. Androgen deprivation therapy: I advised him to continue this indefinitely.  His next injection scheduled soon with Dr. Alyson Ingles.  3. Obstructive uropathy: His symptoms has resolved at this time after the definitive treatment for his prostate cancer.  4. Bone directed therapy: I advised him to start calcium and vitamin D supplements and will consider Xgeva after obtaining dental clearance.  5. Prognosis: Treatment remains palliative but his performance status has been excellent and his response to therapy has been dramatic as well.  Aggressive therapy is  warranted.  6. Follow-up: Will be next 4 months for repeat evaluation.  15  minutes was spent with the patient face-to-face today.  More than 50% of time was dedicated to reviewing the natural course of his disease, treatment options and coordinating plan of care.     Zola Button, MD 10/18/20192:09 PM

## 2018-08-21 NOTE — Telephone Encounter (Signed)
Scheduled appt per 10/18 los - gave patient AVS and calender per los.  

## 2018-08-21 NOTE — Telephone Encounter (Signed)
Patient made aware of low potassium and that a prescription has been called in to [facility], to pick up and start today and take 1 tablet daily for 30 days. Patient verbalized understanding and had no other questions.

## 2018-08-22 LAB — PROSTATE-SPECIFIC AG, SERUM (LABCORP): Prostate Specific Ag, Serum: <0.1 ng/mL (ref 0.0–4.0)

## 2018-08-24 ENCOUNTER — Telehealth: Payer: Self-pay | Admitting: *Deleted

## 2018-08-24 NOTE — Telephone Encounter (Signed)
Spoke with patient, gave results of last PSA 

## 2018-08-24 NOTE — Telephone Encounter (Signed)
-----   Message from Wyatt Portela, MD sent at 08/24/2018  8:22 AM EDT ----- Please let him know his PSA is low.

## 2018-08-31 ENCOUNTER — Other Ambulatory Visit: Payer: Self-pay | Admitting: Oncology

## 2018-08-31 DIAGNOSIS — C61 Malignant neoplasm of prostate: Secondary | ICD-10-CM

## 2018-09-04 MED FILL — ABIRATERONE ACETATE 250 MG: 250 | 30 days supply | Qty: 120 | Fill #0

## 2018-10-05 ENCOUNTER — Other Ambulatory Visit: Payer: Self-pay | Admitting: Oncology

## 2018-10-05 DIAGNOSIS — C61 Malignant neoplasm of prostate: Secondary | ICD-10-CM

## 2018-10-05 MED FILL — ABIRATERONE ACETATE 250 MG: 250 | 30 days supply | Qty: 120 | Fill #0

## 2018-10-21 ENCOUNTER — Encounter: Payer: Self-pay | Admitting: Gastroenterology

## 2018-10-23 ENCOUNTER — Encounter: Payer: Self-pay | Admitting: Internal Medicine

## 2018-10-26 ENCOUNTER — Other Ambulatory Visit: Payer: Self-pay | Admitting: Oncology

## 2018-10-26 DIAGNOSIS — C61 Malignant neoplasm of prostate: Secondary | ICD-10-CM

## 2018-10-30 MED FILL — ABIRATERONE ACETATE 250 MG: 250 | 30 days supply | Qty: 120 | Fill #0

## 2018-11-02 ENCOUNTER — Telehealth: Payer: Self-pay

## 2018-11-02 NOTE — Telephone Encounter (Signed)
Oral Oncology Patient Advocate Encounter  Received notification from [place] that the existing prior authorization for Terry Hinton is due for renewal.  Renewal PA submitted on CoverMyMeds Key [id number] Status is pending  Oral Oncology Clinic will continue to follow.  [place] Patient [name] Phone [phone] Fax [phone]

## 2018-11-02 NOTE — Telephone Encounter (Signed)
Oral Oncology Patient Advocate Encounter  Prior Authorization for Fabio Asa has been approved.    PA# [id number] Effective dates: 11/02/18 through 11/04/19  Oral Oncology Clinic will continue to follow.   [place] Patient [place] Phone [phone] Fax [phone]

## 2018-11-09 ENCOUNTER — Telehealth: Payer: Self-pay | Admitting: *Deleted

## 2018-11-09 NOTE — Telephone Encounter (Signed)
Dr Loletha Carrow,  This pt is scheduled for a direct screening colon with you -29 WED.  He has a hx of metastatic prostate cancer.    Last onc note assessment---63 year old gentleman with the following issues:  1.  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  He is currently on Zytiga and has tolerated therapy very well.  His PSA continues to show excellent response and declines appropriately.  Risks and benefits of continuing this therapy in addition to androgen deprivation was reviewed.  Long-term complication associated with this medication was also discussed.  These complications include hypertension, adrenal insufficiency among others.  He is agreeable to continue at this time.  2. Androgen deprivation therapy: I advised him to continue this indefinitely.  His next injection scheduled soon with Dr. Alyson Ingles.  3. Obstructive uropathy: His symptoms has resolved at this time after the definitive treatment for his prostate cancer.  4.Bone directed therapy: I advised him to start calcium and vitamin D supplements and will consider Xgeva after obtaining dental clearance.  5. Prognosis: Treatment remains palliative but his performance status has been excellent and his response to therapy has been dramatic as well.  Aggressive therapy is warranted.  6. Follow-up: Will be next 4 months for repeat evaluation.  106minutes was spent with the patient face-to-face today. More than 50% of time was dedicated to reviewing the natural course of his disease, treatment options and coordinating plan of care.   I wanted to be sure he was ok for direct and did not need an OV.  Thanks so much, Lelan Pons PV

## 2018-11-09 NOTE — Telephone Encounter (Signed)
Thank you- will proceed as scheduled. Terry Hinton

## 2018-11-09 NOTE — Telephone Encounter (Signed)
Thanks for checking-  He is OK for direct book colon in [place].

## 2018-11-18 ENCOUNTER — Ambulatory Visit (AMBULATORY_SURGERY_CENTER): Payer: Self-pay | Admitting: *Deleted

## 2018-11-18 VITALS — Ht >= 80 in | Wt 280.0 lb

## 2018-11-18 DIAGNOSIS — Z1211 Encounter for screening for malignant neoplasm of colon: Secondary | ICD-10-CM

## 2018-11-18 MED ORDER — PEG-KCL-NACL-NASULF-NA ASC-C 140 G PO SOLR: 1.0000 | ORAL | 0 refills | Status: DC

## 2018-11-18 NOTE — Progress Notes (Signed)
No egg or soy allergy known to patient  No issues with past sedation with any surgeries  or procedures, no intubation problems - no issues  No diet pills per patient No home 02 use per patient  No blood thinners per patient  Pt denies issues with constipation  No A fib or A flutter  EMMI video sent to pt's e mail-- no email   Pt has 100 % financial assistance with [employer]- Plenvu sample Lot [id number] exp 05/2019 as directed

## 2018-11-25 ENCOUNTER — Other Ambulatory Visit: Payer: Self-pay | Admitting: Oncology

## 2018-11-25 DIAGNOSIS — C61 Malignant neoplasm of prostate: Secondary | ICD-10-CM

## 2018-11-30 MED FILL — ABIRATERONE ACETATE 250 MG: 250 | 30 days supply | Qty: 120 | Fill #0

## 2018-12-02 ENCOUNTER — Ambulatory Visit (AMBULATORY_SURGERY_CENTER): Payer: Medicare Other | Admitting: Gastroenterology

## 2018-12-02 ENCOUNTER — Encounter: Payer: Self-pay | Admitting: Gastroenterology

## 2018-12-02 VITALS — BP 126/84 | HR 76 | Temp 96.8°F | Resp 16 | Ht >= 80 in | Wt 281.0 lb

## 2018-12-02 DIAGNOSIS — D122 Benign neoplasm of ascending colon: Secondary | ICD-10-CM

## 2018-12-02 DIAGNOSIS — Z1211 Encounter for screening for malignant neoplasm of colon: Secondary | ICD-10-CM

## 2018-12-02 DIAGNOSIS — D123 Benign neoplasm of transverse colon: Secondary | ICD-10-CM

## 2018-12-02 MED ORDER — SODIUM CHLORIDE 0.9 % IV SOLN: 500.0000 mL | Freq: Once | INTRAVENOUS | Status: DC

## 2018-12-02 NOTE — Progress Notes (Signed)
Called to room to assist during endoscopic procedure.  Patient ID and intended procedure confirmed with present staff. Received instructions for my participation in the procedure from the performing physician.  

## 2018-12-02 NOTE — Op Note (Signed)
Terry Hinton Patient Name: Terry Hinton Procedure Date: 12/02/2018 10:03 AM MRN: [id number] Endoscopist: Mallie Mussel L. Loletha Carrow , MD Age: 63 Referring MD:  Date of Birth: Sep 01, 1956 Gender: Male Account #: [id number] Procedure:                Colonoscopy Indications:              Screening for colorectal malignant neoplasm, This                            is the patient's first colonoscopy Medicines:                Monitored Anesthesia Care Procedure:                Pre-Anesthesia Assessment:                           - Prior to the procedure, a History and Physical                            was performed, and patient medications and                            allergies were reviewed. The patient's tolerance of                            previous anesthesia was also reviewed. The risks                            and benefits of the procedure and the sedation                            options and risks were discussed with the patient.                            All questions were answered, and informed consent                            was obtained. Prior Anticoagulants: The patient has                            taken no previous anticoagulant or antiplatelet                            agents. ASA Grade Assessment: II - A patient with                            mild systemic disease. After reviewing the risks                            and benefits, the patient was deemed in                            satisfactory condition to undergo the procedure.  After obtaining informed consent, the colonoscope                            was passed under direct vision. Throughout the                            procedure, the patient's blood pressure, pulse, and                            oxygen saturations were monitored continuously. The                            Colonoscope was introduced through the anus and                            advanced to the the cecum,  identified by                            appendiceal orifice and ileocecal valve. The                            colonoscopy was performed without difficulty. The                            patient tolerated the procedure well. The quality                            of the bowel preparation was excellent. The                            ileocecal valve, appendiceal orifice, and rectum                            were photographed. The bowel preparation used was                            Plenvu. Scope In: 10:08:16 AM Scope Out: 10:21:41 AM Scope Withdrawal Time: 0 hours 10 minutes 1 second  Total Procedure Duration: 0 hours 13 minutes 25 seconds  Findings:                 The perianal and digital rectal examinations were                            normal.                           Two sessile polyps were found in the transverse                            colon and ascending colon. The polyps were 4 to 6                            mm in size. These polyps were removed with a cold  snare. Resection and retrieval were complete.                           The entire examined colon appeared normal on direct                            and retroflexion views. Complications:            No immediate complications. Estimated Blood Loss:     Estimated blood loss: none. Estimated blood loss                            was minimal. Impression:               - Two 4 to 6 mm polyps in the transverse colon and                            in the ascending colon, removed with a cold snare.                            Resected and retrieved.                           - The entire examined colon is normal on direct and                            retroflexion views. Recommendation:           - Patient has a contact number available for                            emergencies. The signs and symptoms of potential                            delayed complications were discussed with the                             patient. Return to normal activities tomorrow.                            Written discharge instructions were provided to the                            patient.                           - Resume previous diet.                           - Continue present medications.                           - Await pathology results.                           - Repeat colonoscopy is recommended for  surveillance. The colonoscopy date will be                            determined after pathology results from today's                            exam become available for review. Terry Hinton L. Loletha Carrow, MD 12/02/2018 10:37:03 AM This report has been signed electronically.

## 2018-12-02 NOTE — Patient Instructions (Signed)
YOU HAD AN ENDOSCOPIC PROCEDURE TODAY AT THE [facility]:   Refer to the procedure report that was given to you for any specific questions about what was found during the examination.  If the procedure report does not answer your questions, please call your gastroenterologist to clarify.  If you requested that your care partner not be given the details of your procedure findings, then the procedure report has been included in a sealed envelope for you to review at your convenience later.  YOU SHOULD EXPECT: Some feelings of bloating in the abdomen. Passage of more gas than usual.  Walking can help get rid of the air that was put into your GI tract during the procedure and reduce the bloating. If you had a lower endoscopy (such as a colonoscopy or flexible sigmoidoscopy) you may notice spotting of blood in your stool or on the toilet paper. If you underwent a bowel prep for your procedure, you may not have a normal bowel movement for a few days.  Please Note:  You might notice some irritation and congestion in your nose or some drainage.  This is from the oxygen used during your procedure.  There is no need for concern and it should clear up in a day or so.  SYMPTOMS TO REPORT IMMEDIATELY:   Following lower endoscopy (colonoscopy or flexible sigmoidoscopy):  Excessive amounts of blood in the stool  Significant tenderness or worsening of abdominal pains  Swelling of the abdomen that is new, acute  Fever of 100F or higher  For urgent or emergent issues, a gastroenterologist can be reached at any hour by calling [phone].   DIET:  We do recommend a small meal at first, but then you may proceed to your regular diet.  Drink plenty of fluids but you should avoid alcoholic beverages for 24 hours.  MEDICATIONS: Continue present medications.  Please see handouts given to you by your recovery nurse.  ACTIVITY:  You should plan to take it easy for the rest of today and you should NOT  DRIVE or use heavy machinery until tomorrow (because of the sedation medicines used during the test).    FOLLOW UP: Our staff will call the number listed on your records the next business day following your procedure to check on you and address any questions or concerns that you may have regarding the information given to you following your procedure. If we do not reach you, we will leave a message.  However, if you are feeling well and you are not experiencing any problems, there is no need to return our call.  We will assume that you have returned to your regular daily activities without incident.  If any biopsies were taken you will be contacted by phone or by letter within the next 1-3 weeks.  Please call us at [phone] if you have not heard about the biopsies in 3 weeks.   Thank you for allowing us to provide for your healthcare needs today.   SIGNATURES/CONFIDENTIALITY: You and/or your care partner have signed paperwork which will be entered into your electronic medical record.  These signatures attest to the fact that that the information above on your After Visit Summary has been reviewed and is understood.  Full responsibility of the confidentiality of this discharge information lies with you and/or your care-partner. 

## 2018-12-02 NOTE — Progress Notes (Signed)
A and O x3. Report to RN. Tolerated MAC anesthesia well.

## 2018-12-02 NOTE — Progress Notes (Signed)
Pt's states no medical or surgical changes since previsit or office visit. 

## 2018-12-02 NOTE — Progress Notes (Deleted)
Called to room to assist during endoscopic procedure.  Patient ID and intended procedure confirmed with present staff. Received instructions for my participation in the procedure from the performing physician.  

## 2018-12-03 ENCOUNTER — Telehealth: Payer: Self-pay | Admitting: *Deleted

## 2018-12-03 NOTE — Telephone Encounter (Signed)
  Follow up Call-  Call back number 12/02/2018  Post procedure Call Back phone  # [phone]  Permission to leave phone message Yes  Some recent data might be hidden     Patient questions:  Do you have a fever, pain , or abdominal swelling? No. Pain Score  0 *  Have you tolerated food without any problems? Yes.    Have you been able to return to your normal activities? Yes.    Do you have any questions about your discharge instructions: Diet   No. Medications  No. Follow up visit  No.  Do you have questions or concerns about your Care? No.  Actions: * If pain score is 4 or above: No action needed, pain <4.

## 2018-12-15 ENCOUNTER — Encounter: Payer: Self-pay | Admitting: Gastroenterology

## 2018-12-22 ENCOUNTER — Inpatient Hospital Stay: Payer: Medicare Other

## 2018-12-22 ENCOUNTER — Telehealth: Payer: Self-pay | Admitting: Oncology

## 2018-12-22 ENCOUNTER — Inpatient Hospital Stay: Payer: Medicare Other | Attending: Oncology | Admitting: Oncology

## 2018-12-22 VITALS — BP 126/92 | HR 90 | Temp 97.9°F | Resp 18 | Ht >= 80 in | Wt 281.8 lb

## 2018-12-22 DIAGNOSIS — Z79899 Other long term (current) drug therapy: Secondary | ICD-10-CM

## 2018-12-22 DIAGNOSIS — C61 Malignant neoplasm of prostate: Secondary | ICD-10-CM | POA: Diagnosis present

## 2018-12-22 DIAGNOSIS — C7951 Secondary malignant neoplasm of bone: Secondary | ICD-10-CM

## 2018-12-22 LAB — CBC WITH DIFFERENTIAL (CANCER CENTER ONLY)
ABS IMMATURE GRANULOCYTES: 0.06 10*3/uL (ref 0.00–0.07)
Basophils Absolute: 0.1 10*3/uL (ref 0.0–0.1)
Basophils Relative: 1 %
Eosinophils Absolute: 0.6 10*3/uL — ABNORMAL HIGH (ref 0.0–0.5)
Eosinophils Relative: 9 %
HCT: 41.3 % (ref 39.0–52.0)
Hemoglobin: 13.6 g/dL (ref 13.0–17.0)
Immature Granulocytes: 1 %
Lymphocytes Relative: 29 %
Lymphs Abs: 2.1 10*3/uL (ref 0.7–4.0)
MCH: 29.8 pg (ref 26.0–34.0)
MCHC: 32.9 g/dL (ref 30.0–36.0)
MCV: 90.6 fL (ref 80.0–100.0)
MONO ABS: 0.5 10*3/uL (ref 0.1–1.0)
MONOS PCT: 7 %
Neutro Abs: 3.8 10*3/uL (ref 1.7–7.7)
Neutrophils Relative %: 53 %
Platelet Count: 221 10*3/uL (ref 150–400)
RBC: 4.56 MIL/uL (ref 4.22–5.81)
RDW: 12.2 % (ref 11.5–15.5)
WBC Count: 7.1 10*3/uL (ref 4.0–10.5)
nRBC: 0 % (ref 0.0–0.2)

## 2018-12-22 LAB — CMP (CANCER CENTER ONLY)
ALT: 15 U/L (ref 0–44)
AST: 14 U/L — AB (ref 15–41)
Albumin: 3.9 g/dL (ref 3.5–5.0)
Alkaline Phosphatase: 172 U/L — ABNORMAL HIGH (ref 38–126)
Anion gap: 9 (ref 5–15)
BUN: 11 mg/dL (ref 8–23)
CO2: 27 mmol/L (ref 22–32)
Calcium: 9.3 mg/dL (ref 8.9–10.3)
Chloride: 104 mmol/L (ref 98–111)
Creatinine: 1.02 mg/dL (ref 0.61–1.24)
GFR, Est AFR Am: >60 mL/min (ref >60)
GFR, Estimated: >60 mL/min (ref >60)
GLUCOSE: 110 mg/dL — AB (ref 70–99)
Potassium: 3.5 mmol/L (ref 3.5–5.1)
SODIUM: 140 mmol/L (ref 135–145)
Total Bilirubin: 0.4 mg/dL (ref 0.3–1.2)
Total Protein: 6.7 g/dL (ref 6.5–8.1)

## 2018-12-22 NOTE — Progress Notes (Signed)
Hematology and Oncology Follow Up Visit  Terry Hinton [id number] [birth year] 63 y.o. 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  He presented with disease to the bone and lymphadenopathy as well as PSA was 2078, Gleason score was 4+5 = 9.     Prior Therapy:   Firmagon 240 mg given in 2015.  Patient failed to follow-up after that.  Current therapy:  Androgen deprivation therapy restarted by Dr. Alyson Ingles in February 2019.  Zytiga 1000 mg daily with prednisone 5 mg daily started in March 2019.  Interim History: Terry Hinton is here for repeat evaluation.  Since the last visit, he reports no major changes in his health.  He continues to tolerate Zytiga without any new complaints.  He remains active and continues to attend activities of daily living.  He denies any abdominal distention or early satiety.  He denies any excessive fatigue or pathological fractures.  He denies any urinary issues or complaints.    Patient denied any alteration mental status, neuropathy, confusion or dizziness.  Denies any headaches or lethargy.  Denies any night sweats, weight loss or changes in appetite.  Denied orthopnea, dyspnea on exertion or chest discomfort.  Denies shortness of breath, difficulty breathing hemoptysis or cough.  Denies any abdominal distention, nausea, early satiety or dyspepsia.  Denies any hematuria, frequency, dysuria or nocturia.  Denies any skin irritation, dryness or rash.  Denies any ecchymosis or petechiae.  Denies any lymphadenopathy or clotting.  Denies any heat or cold intolerance.  Denies any anxiety or depression.  Remaining review of system is negative.     Medications: I have reviewed the patient's current medications.  Current Outpatient Medications  Medication Sig Dispense Refill  . abiraterone acetate (ZYTIGA) 250 MG tablet TAKE 4 TABLETS (1,000  MG TOTAL) BY MOUTH DAILY. TAKE ON AN EMPTY STOMACH 1 HOUR BEFORE OR 2 HOURS AFTER A MEAL 120 tablet 0  . cholecalciferol (VITAMIN D) 1000 units tablet Take 1,000 Units by mouth daily.    . cyclobenzaprine (FLEXERIL) 5 MG tablet Take 5 mg by mouth 3 (three) times daily as needed for muscle spasms.    . finasteride (PROSCAR) 5 MG tablet Take 5 mg by mouth daily.    [name] latanoprost (XALATAN) 0.005 % ophthalmic solution Place 1 drop into both eyes at bedtime.    [name] lisinopril-hydrochlorothiazide (PRINZIDE,ZESTORETIC) 10-12.5 MG tablet Take 1 tablet by mouth daily.    [name] OVER THE COUNTER MEDICATION Take 1 tablet by mouth daily.    . potassium chloride SA (K-DUR,KLOR-CON) 20 MEQ tablet Take 1 tablet (20 mEq total) by mouth daily. 30 tablet 0  . predniSONE (DELTASONE) 5 MG tablet Take 1 tablet (5 mg total) by mouth daily with breakfast. 90 tablet 3  . tamsulosin (FLOMAX) 0.4 MG CAPS capsule   3  . traMADol (ULTRAM) 50 MG tablet Take 50 mg by mouth every 6 (six) hours as needed (Pain).     Current Facility-Administered Medications  Medication Dose Route Frequency Provider Last Rate Last Dose  . 0.9 %  sodium chloride infusion  500 mL Intravenous Once Doran Stabler, MD         Allergies: No Known Allergies  Past Medical History, Surgical history, Social history, and Family History were reviewed and updated.    Physical Exam:  Blood pressure (!) 126/92, pulse 90, temperature 97.9 F (36.6 C), temperature source Oral, resp. rate 18,  height 6\' 8"  (2.032 m), weight 281 lb 12.8 oz (127.8 kg), SpO2 95 %.   ECOG: 1   General appearance: Comfortable appearing without any discomfort Head: Normocephalic without any trauma Oropharynx: Mucous membranes are moist and pink without any thrush or ulcers. Eyes: Pupils are equal and round reactive to light. Lymph nodes: No cervical, supraclavicular, inguinal or axillary lymphadenopathy.   Heart:regular rate and rhythm.  S1 and S2 without leg edema. Lung:  Clear without any rhonchi or wheezes.  No dullness to percussion. Abdomin: Soft, nontender, nondistended with good bowel sounds.  No hepatosplenomegaly. Musculoskeletal: No joint deformity or effusion.  Full range of motion noted. Neurological: No deficits noted on motor, sensory and deep tendon reflex exam. Skin: No petechial rash or dryness.  Appeared moist.    .     Lab Results: Lab Results  Component Value Date   WBC 6.1 08/21/2018   HGB 13.4 08/21/2018   HCT 39.9 08/21/2018   MCV 90.7 08/21/2018   PLT 189 08/21/2018     Chemistry      Component Value Date/Time   NA 143 08/21/2018 1329   K 3.0 (LL) 08/21/2018 1329   CL 106 08/21/2018 1329   CO2 26 08/21/2018 1329   BUN 7 (L) 08/21/2018 1329   CREATININE 0.90 08/21/2018 1329      Component Value Date/Time   CALCIUM 9.3 08/21/2018 1329   ALKPHOS 163 (H) 08/21/2018 1329   AST 14 (L) 08/21/2018 1329   ALT 13 08/21/2018 1329   BILITOT 0.6 08/21/2018 1329             Results for Terry Hinton, Terry Hinton (MRN [id number]) as of 12/22/2018 13:22  Ref. Range 02/25/2018 14:04 04/21/2018 14:08 08/21/2018 13:29  Prostate Specific Ag, Serum Latest Ref Range: 0.0 - 4.0 ng/mL 1.8 0.2 <0.1    Assessment and plan:  63 year old man with:  1.  Castration-sensitive prostate cancer diagnosed in 2019.  He was found to have disease in the bone and lymphadenopathy at the time of presentation.  9.  He has tolerated Zytiga with excellent response at this time and that PSA is undetectable.  Risks and benefits of continuing this therapy long-term was reviewed today.  Complications and include arthralgias, fatigue, edema and adrenal sufficiency were reviewed.  2. Androgen deprivation therapy: He continues to receive that under the care of Dr. Alyson Ingles.  I recommended continuing that indefinitely.  3. Obstructive uropathy: Kidney function has normalized at this time.  Lower urinary tract symptoms have resolved.  4. Bone directed therapy: This  will be deferred at this time he is scheduled to have dental procedures in the next few months.  5. Prognosis: His disease is incurable although aggressive therapy is warranted given his excellent performance status.  6. Follow-up: Will be next 4 months.   15  minutes was spent with the patient face-to-face today.  More than 50% of time was dedicated to      Zola Button, MD 2/18/20201:20 PM

## 2018-12-22 NOTE — Telephone Encounter (Signed)
Gave avs and calendar ° °

## 2018-12-23 LAB — PROSTATE-SPECIFIC AG, SERUM (LABCORP): Prostate Specific Ag, Serum: <0.1 ng/mL (ref 0.0–4.0)

## 2018-12-24 ENCOUNTER — Other Ambulatory Visit: Payer: Self-pay | Admitting: Oncology

## 2018-12-24 DIAGNOSIS — C61 Malignant neoplasm of prostate: Secondary | ICD-10-CM

## 2018-12-29 MED FILL — ABIRATERONE ACETATE 250 MG: 250 | 30 days supply | Qty: 120 | Fill #0

## 2019-01-21 ENCOUNTER — Other Ambulatory Visit: Payer: Self-pay | Admitting: Oncology

## 2019-01-21 DIAGNOSIS — C61 Malignant neoplasm of prostate: Secondary | ICD-10-CM

## 2019-01-25 MED FILL — ABIRATERONE ACETATE 250 MG: 250 | 30 days supply | Qty: 120 | Fill #0

## 2019-02-16 ENCOUNTER — Telehealth: Payer: Self-pay | Admitting: Pharmacist

## 2019-02-16 NOTE — Telephone Encounter (Signed)
Oral Chemotherapy Pharmacist Encounter   Attempted to reach patient for follow up on oral medication: Zytiga. No answer on mobile or home numbers in Epic.  I called sister's contact number and voicemail is full. I will continue to reach out to patient for Zytiga follow-up  Johny Drilling, PharmD, BCPS, BCOP  02/16/2019   1:09 PM Oral Oncology Clinic [phone]

## 2019-02-22 NOTE — Telephone Encounter (Signed)
Oral Oncology Patient Advocate Encounter  Terry Hinton has attempted to reach the patient 3 times to refill his Zytiga with no success. I called both numbers listed in Epic and neither will ring. I also called his sisters number, the lady that answered informed me that I have the wrong number.  Terry Hinton Patient [place] Phone [phone] Fax [phone] 02/22/2019   3:40 PM

## 2019-02-24 ENCOUNTER — Other Ambulatory Visit: Payer: Self-pay | Admitting: Oncology

## 2019-02-24 DIAGNOSIS — C61 Malignant neoplasm of prostate: Secondary | ICD-10-CM

## 2019-02-25 MED FILL — ABIRATERONE ACETATE 250 MG: 250 | 30 days supply | Qty: 120 | Fill #0

## 2019-03-27 ENCOUNTER — Other Ambulatory Visit: Payer: Self-pay | Admitting: Oncology

## 2019-03-27 DIAGNOSIS — C61 Malignant neoplasm of prostate: Secondary | ICD-10-CM

## 2019-03-30 MED FILL — ABIRATERONE ACETATE 250 MG: 250 | 30 days supply | Qty: 120 | Fill #0

## 2019-04-21 ENCOUNTER — Inpatient Hospital Stay: Payer: Medicare Other | Attending: Oncology

## 2019-04-21 ENCOUNTER — Inpatient Hospital Stay: Payer: Medicare Other | Admitting: Oncology

## 2019-04-23 ENCOUNTER — Other Ambulatory Visit: Payer: Self-pay | Admitting: Oncology

## 2019-04-23 DIAGNOSIS — C61 Malignant neoplasm of prostate: Secondary | ICD-10-CM

## 2019-04-26 MED FILL — ABIRATERONE ACETATE 250 MG: 250 | 30 days supply | Qty: 120 | Fill #0

## 2019-05-26 ENCOUNTER — Other Ambulatory Visit: Payer: Self-pay | Admitting: Oncology

## 2019-05-26 DIAGNOSIS — C61 Malignant neoplasm of prostate: Secondary | ICD-10-CM

## 2019-05-27 MED FILL — ABIRATERONE ACETATE 250 MG: 250 | 30 days supply | Qty: 120 | Fill #0

## 2019-06-18 ENCOUNTER — Other Ambulatory Visit: Payer: Self-pay | Admitting: Oncology

## 2019-06-18 DIAGNOSIS — C61 Malignant neoplasm of prostate: Secondary | ICD-10-CM

## 2019-06-23 MED FILL — ABIRATERONE ACETATE 250 MG: 250 | 30 days supply | Qty: 120 | Fill #0

## 2019-07-16 ENCOUNTER — Other Ambulatory Visit: Payer: Self-pay | Admitting: Oncology

## 2019-07-16 DIAGNOSIS — C61 Malignant neoplasm of prostate: Secondary | ICD-10-CM

## 2019-07-19 MED FILL — ABIRATERONE ACETATE 250 MG: 250 | 30 days supply | Qty: 120 | Fill #0

## 2019-08-11 ENCOUNTER — Other Ambulatory Visit: Payer: Self-pay | Admitting: Oncology

## 2019-08-11 DIAGNOSIS — C61 Malignant neoplasm of prostate: Secondary | ICD-10-CM

## 2019-08-16 MED FILL — ABIRATERONE ACETATE 250 MG: 250 | 30 days supply | Qty: 120 | Fill #0

## 2019-09-09 ENCOUNTER — Other Ambulatory Visit: Payer: Self-pay | Admitting: Oncology

## 2019-09-09 DIAGNOSIS — C61 Malignant neoplasm of prostate: Secondary | ICD-10-CM

## 2019-09-13 MED FILL — ABIRATERONE ACETATE 250 MG: 250 | 30 days supply | Qty: 120 | Fill #0

## 2019-10-11 ENCOUNTER — Other Ambulatory Visit: Payer: Self-pay | Admitting: Oncology

## 2019-10-11 DIAGNOSIS — C61 Malignant neoplasm of prostate: Secondary | ICD-10-CM

## 2019-10-11 MED FILL — ABIRATERONE ACETATE 250 MG: 250 | 30 days supply | Qty: 120 | Fill #0

## 2019-10-15 ENCOUNTER — Other Ambulatory Visit: Payer: Self-pay | Admitting: Oncology

## 2019-10-15 DIAGNOSIS — C61 Malignant neoplasm of prostate: Secondary | ICD-10-CM

## 2019-11-11 MED FILL — ABIRATERONE ACETATE 250 MG: 250 | 30 days supply | Qty: 120 | Fill #0

## 2019-12-03 ENCOUNTER — Other Ambulatory Visit: Payer: Self-pay | Admitting: Oncology

## 2019-12-03 DIAGNOSIS — C61 Malignant neoplasm of prostate: Secondary | ICD-10-CM

## 2019-12-06 MED FILL — ABIRATERONE ACETATE 250 MG: 250 | 30 days supply | Qty: 120 | Fill #0

## 2019-12-23 DIAGNOSIS — M5136 Other intervertebral disc degeneration, lumbar region: Secondary | ICD-10-CM | POA: Diagnosis not present

## 2019-12-28 ENCOUNTER — Other Ambulatory Visit: Payer: Self-pay | Admitting: Oncology

## 2019-12-28 DIAGNOSIS — C61 Malignant neoplasm of prostate: Secondary | ICD-10-CM

## 2019-12-30 MED FILL — ABIRATERONE ACETATE 250 MG: 250 | 30 days supply | Qty: 120 | Fill #0

## 2020-01-20 ENCOUNTER — Other Ambulatory Visit: Payer: Self-pay | Admitting: Oncology

## 2020-01-20 DIAGNOSIS — C61 Malignant neoplasm of prostate: Secondary | ICD-10-CM

## 2020-01-24 MED FILL — ABIRATERONE ACETATE 250 MG: 250 | 30 days supply | Qty: 120 | Fill #0

## 2020-02-03 DIAGNOSIS — E1136 Type 2 diabetes mellitus with diabetic cataract: Secondary | ICD-10-CM | POA: Diagnosis not present

## 2020-02-03 DIAGNOSIS — Z008 Encounter for other general examination: Secondary | ICD-10-CM | POA: Diagnosis not present

## 2020-02-03 DIAGNOSIS — G8929 Other chronic pain: Secondary | ICD-10-CM | POA: Diagnosis not present

## 2020-02-03 DIAGNOSIS — C7951 Secondary malignant neoplasm of bone: Secondary | ICD-10-CM | POA: Diagnosis not present

## 2020-02-18 ENCOUNTER — Other Ambulatory Visit: Payer: Self-pay | Admitting: Oncology

## 2020-02-18 DIAGNOSIS — C61 Malignant neoplasm of prostate: Secondary | ICD-10-CM

## 2020-02-23 MED FILL — ABIRATERONE ACETATE 250 MG: 250 | 30 days supply | Qty: 120 | Fill #0

## 2020-03-20 ENCOUNTER — Other Ambulatory Visit: Payer: Self-pay | Admitting: Oncology

## 2020-03-20 DIAGNOSIS — C61 Malignant neoplasm of prostate: Secondary | ICD-10-CM

## 2020-03-21 MED FILL — ABIRATERONE ACETATE 250 MG: 250 | 30 days supply | Qty: 120 | Fill #0

## 2020-04-13 ENCOUNTER — Other Ambulatory Visit: Payer: Self-pay | Admitting: Oncology

## 2020-04-13 DIAGNOSIS — C61 Malignant neoplasm of prostate: Secondary | ICD-10-CM

## 2020-04-18 MED FILL — ABIRATERONE ACETATE 250 MG: 250 | 30 days supply | Qty: 120 | Fill #0

## 2020-05-07 ENCOUNTER — Emergency Department (HOSPITAL_COMMUNITY): Payer: Medicare Other

## 2020-05-07 ENCOUNTER — Encounter (HOSPITAL_COMMUNITY): Payer: Self-pay | Admitting: *Deleted

## 2020-05-07 ENCOUNTER — Inpatient Hospital Stay (HOSPITAL_COMMUNITY)
Admission: EM | Admit: 2020-05-07 | Discharge: 2020-05-09 | DRG: 638 | Disposition: A | Discharge status: Home / Self Care | Payer: Medicare Other | Attending: Internal Medicine | Admitting: Internal Medicine

## 2020-05-07 ENCOUNTER — Other Ambulatory Visit: Payer: Self-pay

## 2020-05-07 DIAGNOSIS — Z79899 Other long term (current) drug therapy: Secondary | ICD-10-CM | POA: Diagnosis not present

## 2020-05-07 DIAGNOSIS — Z803 Family history of malignant neoplasm of breast: Secondary | ICD-10-CM

## 2020-05-07 DIAGNOSIS — E876 Hypokalemia: Secondary | ICD-10-CM | POA: Diagnosis not present

## 2020-05-07 DIAGNOSIS — R42 Dizziness and giddiness: Secondary | ICD-10-CM | POA: Diagnosis not present

## 2020-05-07 DIAGNOSIS — E1110 Type 2 diabetes mellitus with ketoacidosis without coma: Principal | ICD-10-CM | POA: Diagnosis present

## 2020-05-07 DIAGNOSIS — C61 Malignant neoplasm of prostate: Secondary | ICD-10-CM | POA: Diagnosis present

## 2020-05-07 DIAGNOSIS — Z7952 Long term (current) use of systemic steroids: Secondary | ICD-10-CM | POA: Diagnosis not present

## 2020-05-07 DIAGNOSIS — N179 Acute kidney failure, unspecified: Secondary | ICD-10-CM | POA: Diagnosis present

## 2020-05-07 DIAGNOSIS — K760 Fatty (change of) liver, not elsewhere classified: Secondary | ICD-10-CM | POA: Diagnosis present

## 2020-05-07 DIAGNOSIS — N390 Urinary tract infection, site not specified: Secondary | ICD-10-CM

## 2020-05-07 DIAGNOSIS — Z87891 Personal history of nicotine dependence: Secondary | ICD-10-CM | POA: Diagnosis not present

## 2020-05-07 DIAGNOSIS — K449 Diaphragmatic hernia without obstruction or gangrene: Secondary | ICD-10-CM | POA: Diagnosis present

## 2020-05-07 DIAGNOSIS — K5790 Diverticulosis of intestine, part unspecified, without perforation or abscess without bleeding: Secondary | ICD-10-CM | POA: Diagnosis not present

## 2020-05-07 DIAGNOSIS — N400 Enlarged prostate without lower urinary tract symptoms: Secondary | ICD-10-CM | POA: Diagnosis present

## 2020-05-07 DIAGNOSIS — Z832 Family history of diseases of the blood and blood-forming organs and certain disorders involving the immune mechanism: Secondary | ICD-10-CM

## 2020-05-07 DIAGNOSIS — E875 Hyperkalemia: Secondary | ICD-10-CM | POA: Diagnosis present

## 2020-05-07 DIAGNOSIS — Z20822 Contact with and (suspected) exposure to covid-19: Secondary | ICD-10-CM | POA: Diagnosis present

## 2020-05-07 DIAGNOSIS — R739 Hyperglycemia, unspecified: Secondary | ICD-10-CM | POA: Diagnosis present

## 2020-05-07 DIAGNOSIS — E119 Type 2 diabetes mellitus without complications: Secondary | ICD-10-CM

## 2020-05-07 DIAGNOSIS — N289 Disorder of kidney and ureter, unspecified: Secondary | ICD-10-CM | POA: Diagnosis not present

## 2020-05-07 DIAGNOSIS — I10 Essential (primary) hypertension: Secondary | ICD-10-CM | POA: Diagnosis present

## 2020-05-07 DIAGNOSIS — R531 Weakness: Secondary | ICD-10-CM | POA: Diagnosis not present

## 2020-05-07 DIAGNOSIS — K314 Gastric diverticulum: Secondary | ICD-10-CM | POA: Diagnosis not present

## 2020-05-07 DIAGNOSIS — Z85830 Personal history of malignant neoplasm of bone: Secondary | ICD-10-CM

## 2020-05-07 LAB — COMPREHENSIVE METABOLIC PANEL
ALT: 23 U/L (ref 0–44)
AST: 17 U/L (ref 15–41)
Albumin: 4.5 g/dL (ref 3.5–5.0)
Alkaline Phosphatase: 215 U/L — ABNORMAL HIGH (ref 38–126)
Anion gap: 20 — ABNORMAL HIGH (ref 5–15)
BUN: 25 mg/dL — ABNORMAL HIGH (ref 8–23)
CO2: 24 mmol/L (ref 22–32)
Calcium: 9.3 mg/dL (ref 8.9–10.3)
Chloride: 87 mmol/L — ABNORMAL LOW (ref 98–111)
Creatinine, Ser: 1.32 mg/dL — ABNORMAL HIGH (ref 0.61–1.24)
GFR calc Af Amer: >60 mL/min (ref >60)
GFR calc non Af Amer: 57 mL/min — ABNORMAL LOW (ref >60)
Glucose, Bld: 526 mg/dL (ref 70–99)
Potassium: 4.2 mmol/L (ref 3.5–5.1)
Sodium: 131 mmol/L — ABNORMAL LOW (ref 135–145)
Total Bilirubin: 1.7 mg/dL — ABNORMAL HIGH (ref 0.3–1.2)
Total Protein: 7.6 g/dL (ref 6.5–8.1)

## 2020-05-07 LAB — CBC
HCT: 45.9 % (ref 39.0–52.0)
Hemoglobin: 15.7 g/dL (ref 13.0–17.0)
MCH: 29.7 pg (ref 26.0–34.0)
MCHC: 34.2 g/dL (ref 30.0–36.0)
MCV: 86.9 fL (ref 80.0–100.0)
Platelets: 163 10*3/uL (ref 150–400)
RBC: 5.28 MIL/uL (ref 4.22–5.81)
RDW: 11.4 % — ABNORMAL LOW (ref 11.5–15.5)
WBC: 11.3 10*3/uL — ABNORMAL HIGH (ref 4.0–10.5)
nRBC: 0 % (ref 0.0–0.2)

## 2020-05-07 LAB — URINALYSIS, ROUTINE W REFLEX MICROSCOPIC
Bilirubin Urine: NEGATIVE
Glucose, UA: >=500 mg/dL — AB
Ketones, ur: 80 mg/dL — AB
Nitrite: NEGATIVE
Protein, ur: NEGATIVE mg/dL
Specific Gravity, Urine: 1.036 — ABNORMAL HIGH (ref 1.005–1.030)
WBC, UA: >50 WBC/hpf — ABNORMAL HIGH (ref 0–5)
pH: 5 (ref 5.0–8.0)

## 2020-05-07 LAB — SARS CORONAVIRUS 2 BY RT PCR (HOSPITAL ORDER, PERFORMED IN ~~LOC~~ HOSPITAL LAB): SARS Coronavirus 2: NEGATIVE

## 2020-05-07 LAB — LIPASE, BLOOD: Lipase: 26 U/L (ref 11–51)

## 2020-05-07 LAB — CBG MONITORING, ED: Glucose-Capillary: 377 mg/dL — ABNORMAL HIGH (ref 70–99)

## 2020-05-07 MED ORDER — DEXTROSE-NACL 5-0.45 % IV SOLN: INTRAVENOUS | Status: DC

## 2020-05-07 MED ORDER — SODIUM CHLORIDE 0.9% FLUSH
3.0000 mL | Freq: Once | INTRAVENOUS | Status: AC
  Administered 2020-05-07: 3 mL via INTRAVENOUS

## 2020-05-07 MED ORDER — SODIUM CHLORIDE 0.9 % IV SOLN: INTRAVENOUS | Status: DC

## 2020-05-07 MED ORDER — POTASSIUM CHLORIDE 10 MEQ/100ML IV SOLN
10.0000 meq | INTRAVENOUS | Status: AC
  Administered 2020-05-07 – 2020-05-08 (×2): 10 meq via INTRAVENOUS
  Filled 2020-05-07 (×2): qty 100

## 2020-05-07 MED ORDER — SODIUM CHLORIDE (PF) 0.9 % IJ SOLN
INTRAMUSCULAR | Status: AC
  Filled 2020-05-07: qty 50

## 2020-05-07 MED ORDER — DEXTROSE 50 % IV SOLN: 0.0000 mL | INTRAVENOUS | Status: DC | PRN

## 2020-05-07 MED ORDER — IOHEXOL 300 MG/ML  SOLN
100.0000 mL | Freq: Once | INTRAMUSCULAR | Status: AC | PRN
  Administered 2020-05-07: 100 mL via INTRAVENOUS

## 2020-05-07 MED ORDER — SODIUM CHLORIDE 0.9 % IV BOLUS
1000.0000 mL | Freq: Once | INTRAVENOUS | Status: AC
  Administered 2020-05-07: 1000 mL via INTRAVENOUS

## 2020-05-07 MED ORDER — INSULIN REGULAR(HUMAN) IN NACL 100-0.9 UT/100ML-% IV SOLN
INTRAVENOUS | Status: DC
  Administered 2020-05-08: 6.5 [IU]/h via INTRAVENOUS
  Filled 2020-05-07: qty 100

## 2020-05-07 NOTE — ED Triage Notes (Addendum)
Pt rambling in conversation, states he has been loosing weight over the last 2 weeks and is having difficulty eating. Unable to fully understand if there is a specific cause other than he is talking about people not having Covid shots.   During triage he starts talking about his penis is drawing up when he urinates.

## 2020-05-07 NOTE — ED Provider Notes (Signed)
[facility] DEPT Provider Note   CSN: [id number] Arrival date & time: 05/07/20  1542     History Chief Complaint  Patient presents with  . Weight Loss  . poor appetite    Terry Hinton is a 64 y.o. male.  Patient is a 64 year old male who presents with abdominal pain associated with nausea and vomiting.  He has a history of hypertension, BPH and prostate cancer.  He says for the last week he has not been able to keep anything down.  He says he has been losing weight.  He says whenever he tries to eat anything, his body does not accept it.  Whenever he tries to eat anything including water, he throws it back up.  He said he did manage to eat a little bit of fruit but that has been it.  He feels weak all over.  He feels lightheaded.  He is having a hard time walking because of the generalized weakness.  He says he feels like his testicles are "drawing up".  He cannot really elaborate on what that means.  He says sometimes he has some pain on urination.  He denies any chest pain or shortness of breath.  He does have a little bit of a cough.  He does not have any nasal congestion although he says his nose and his mouth have been very dry.  He did complete his [employer] immunization he thinks in May.  He says he was exposed to a coworker that he thinks had Covid a few weeks ago.  He said no diarrhea.  His last bowel movement was about 3 days ago.        Past Medical History:  Diagnosis Date  . Allergy   . Anemia   . Arthritis    knee  . Back pain    back, neck and leg problems, pt. stated  . Enlarged prostate    pt. stated  . Hypertension   . Leg injury   . Prostate cancer ([place])    Mets to Bone     There are no problems to display for this patient.   Past Surgical History:  Procedure Laterality Date  . NO PAST SURGERIES    . PROSTATE BIOPSY         Family History  Problem Relation Age of Onset  . Breast cancer Mother   . Sickle  cell anemia Sister   . Colon cancer Neg Hx   . Colon polyps Neg Hx   . Esophageal cancer Neg Hx   . Rectal cancer Neg Hx   . Stomach cancer Neg Hx     Social History   Tobacco Use  . Smoking status: Former Smoker    Types: Cigarettes    Quit date: 05/08/2014    Years since quitting: 6.0  . Smokeless tobacco: Never Used  Substance Use Topics  . Alcohol use: Yes    Comment: beer occasionally   . Drug use: No    Home Medications Prior to Admission medications   Medication Sig Start Date End Date Taking? Authorizing Provider  abiraterone acetate (ZYTIGA) 250 MG tablet TAKE 4 TABLETS (1,000 MG TOTAL) BY MOUTH DAILY. TAKE ON AN EMPTY STOMACH 1 HOUR BEFORE OR 2 HOURS AFTER A MEAL 04/13/20  Yes Wyatt Portela, MD  cholecalciferol (VITAMIN D) 1000 units tablet Take 1,000 Units by mouth daily.   Yes [provider]  cyclobenzaprine (FLEXERIL) 5 MG tablet Take 5 mg by mouth  3 (three) times daily as needed for muscle spasms.   Yes [provider]  finasteride (PROSCAR) 5 MG tablet Take 5 mg by mouth daily.   Yes [provider]  latanoprost (XALATAN) 0.005 % ophthalmic solution Place 1 drop into both eyes at bedtime.   Yes [provider]  lisinopril-hydrochlorothiazide (PRINZIDE,ZESTORETIC) 10-12.5 MG tablet Take 1 tablet by mouth daily.   Yes [provider]  OVER THE COUNTER MEDICATION Take 1 tablet by mouth daily.   Yes [provider]  potassium chloride SA (K-DUR,KLOR-CON) 20 MEQ tablet Take 1 tablet (20 mEq total) by mouth daily. 08/21/18  Yes Wyatt Portela, MD  predniSONE (DELTASONE) 5 MG tablet Take 1 tablet (5 mg total) by mouth daily with breakfast. 01/14/18  Yes Wyatt Portela, MD  tamsulosin (FLOMAX) 0.4 MG CAPS capsule Take 0.4 mg by mouth daily.  02/05/18  Yes [provider]  traMADol (ULTRAM) 50 MG tablet Take 50 mg by mouth every 6 (six) hours as needed (Pain).   Yes [provider]    Allergies      Patient has no known allergies.  Review of Systems   Review of Systems  Constitutional: Positive for fatigue. Negative for chills, diaphoresis and fever.  HENT: Negative for congestion, rhinorrhea and sneezing.   Eyes: Negative.   Respiratory: Positive for cough. Negative for chest tightness and shortness of breath.   Cardiovascular: Negative for chest pain and leg swelling.  Gastrointestinal: Positive for abdominal pain, nausea and vomiting. Negative for blood in stool and diarrhea.  Genitourinary: Negative for difficulty urinating, flank pain, frequency and hematuria.  Musculoskeletal: Negative for arthralgias and back pain.  Skin: Negative for rash.  Neurological: Positive for weakness (generalized) and light-headedness. Negative for dizziness, speech difficulty, numbness and headaches.    Physical Exam Updated Vital Signs BP 132/86   Pulse 83   Temp 97.8 F (36.6 C) (Oral)   Resp 16   Ht 6\' 8"  (2.032 m)   SpO2 93%   BMI 30.96 kg/m   Physical Exam Constitutional:      Appearance: He is well-developed.  HENT:     Head: Normocephalic and atraumatic.     Mouth/Throat:     Pharynx: No oropharyngeal exudate or posterior oropharyngeal erythema.     Comments: Slightly dry mucous membranes Eyes:     Pupils: Pupils are equal, round, and reactive to light.  Cardiovascular:     Rate and Rhythm: Normal rate and regular rhythm.     Heart sounds: Normal heart sounds.  Pulmonary:     Effort: Pulmonary effort is normal. No respiratory distress.     Breath sounds: Normal breath sounds. No wheezing or rales.  Chest:     Chest wall: No tenderness.  Abdominal:     General: Bowel sounds are normal.     Palpations: Abdomen is soft.     Tenderness: There is abdominal tenderness (Generalized tenderness). There is no guarding or rebound.  Musculoskeletal:        General: Normal range of motion.     Cervical back: Normal range of motion and neck supple.  Lymphadenopathy:      Cervical: No cervical adenopathy.  Skin:    General: Skin is warm and dry.     Findings: No rash.  Neurological:     Mental Status: He is alert and oriented to person, place, and time.     ED Results / Procedures / Treatments   Labs (all labs ordered are  listed, but only abnormal results are displayed) Labs Reviewed  COMPREHENSIVE METABOLIC PANEL - Abnormal; Notable for the following components:      Result Value   Sodium 131 (*)    Chloride 87 (*)    Glucose, Bld 526 (*)    BUN 25 (*)    Creatinine, Ser 1.32 (*)    Alkaline Phosphatase 215 (*)    Total Bilirubin 1.7 (*)    GFR calc non Af Amer 57 (*)    Anion gap 20 (*)    All other components within normal limits  CBC - Abnormal; Notable for the following components:   WBC 11.3 (*)    RDW 11.4 (*)    All other components within normal limits  URINALYSIS, ROUTINE W REFLEX MICROSCOPIC - Abnormal; Notable for the following components:   APPearance HAZY (*)    Specific Gravity, Urine 1.036 (*)    Glucose, UA >=500 (*)    Hgb urine dipstick SMALL (*)    Ketones, ur 80 (*)    Leukocytes,Ua SMALL (*)    WBC, UA >50 (*)    Bacteria, UA RARE (*)    All other components within normal limits  SARS CORONAVIRUS 2 BY RT PCR (HOSPITAL ORDER, [facility])  LIPASE, BLOOD  BETA-HYDROXYBUTYRIC ACID  BETA-HYDROXYBUTYRIC ACID  BLOOD GAS, VENOUS  BETA-HYDROXYBUTYRIC ACID  CBG MONITORING, ED  CBG MONITORING, ED    EKG EKG Interpretation  Date/Time:  Sunday May 07 2020 20:40:23 EDT Ventricular Rate:  90 PR Interval:    QRS Duration: 97 QT Interval:  410 QTC Calculation: 502 R Axis:   -27 Text Interpretation: Sinus rhythm Inferior infarct, old Consider anterior infarct Lateral leads are also involved Prolonged QT interval No old tracing to compare Confirmed by Malvin Johns [phone]) on 05/07/2020 9:04:32 PM   Radiology DG Chest 2 View  Result Date: 05/07/2020 CLINICAL DATA:  Cough, weakness, dizziness  EXAM: CHEST - 2 VIEW COMPARISON:  None. FINDINGS: The heart size and mediastinal contours are within normal limits. The left hemidiaphragm is obscured which may reflect a trace left pleural effusion or pericardial fat. The right lung is clear. There is no right pleural effusion. There is no pneumothorax. 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 IMPRESSION: Obscuration of the left hemidiaphragm may reflect trace left pleural effusion versus pericardial fat. Electronically Signed   By: Zerita Boers M.D.   On: 05/07/2020 20:47   CT Abdomen Pelvis W Contrast  Result Date: 05/07/2020 CLINICAL DATA:  Weight loss and difficulty eating. History of prostate cancer. EXAM: CT ABDOMEN AND PELVIS WITH CONTRAST TECHNIQUE: Multidetector CT imaging of the abdomen and pelvis was performed using the standard protocol following bolus administration of intravenous contrast. CONTRAST:  124mL OMNIPAQUE IOHEXOL 300 MG/ML  SOLN COMPARISON:  December 25, 2017 FINDINGS: Lower chest: No acute abnormality. Hepatobiliary: There is diffuse fatty infiltration of the liver parenchyma. A 9 mm focus of parenchymal low attenuation is seen within the anteromedial aspect of the right lobe of the liver. No gallstones, gallbladder wall thickening, or biliary dilatation. Pancreas: Unremarkable. No pancreatic ductal dilatation or surrounding inflammatory changes. Spleen: Normal in size without focal abnormality. Adrenals/Urinary Tract: Adrenal glands are unremarkable. Kidneys are normal, without renal calculi, focal lesion, or hydronephrosis. Bladder is unremarkable. Stomach/Bowel: There is a small hiatal hernia. A 2.3 cm x 2.0 cm gastric diverticulum is seen along the posterior aspect of the gastric fundus. Appendix appears normal. No evidence of  bowel wall thickening, distention, or inflammatory changes. Vascular/Lymphatic: No significant vascular findings are present. No enlarged abdominal or pelvic lymph  nodes. Reproductive: The prostate gland is mildly enlarged. A mild amount of mass effect is seen along the base of the urinary bladder. Other: No abdominal wall hernia or abnormality. No abdominopelvic ascites. Musculoskeletal: Marked severity degenerative changes are seen at the level of L5-S1. Mild diffuse sclerotic changes are again seen involving the right iliac bone. IMPRESSION: 1. Small hiatal hernia. 2. Hepatic steatosis. 3. Small gastric diverticulum. 4. 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 5. Mildly enlarged prostate gland with mass effect along the base of the urinary bladder. Further evaluation is recommended, as recurring neoplasm cannot be excluded. Electronically Signed   By: Virgina Norfolk M.D.   On: 05/07/2020 22:51    Procedures Procedures (including critical care time)  Medications Ordered in ED Medications  sodium chloride (PF) 0.9 % injection (has no administration in time range)  sodium chloride 0.9 % bolus 1,000 mL (has no administration in time range)  insulin regular, human (MYXREDLIN) 100 units/ 100 mL infusion (has no administration in time range)  0.9 %  sodium chloride infusion (has no administration in time range)  dextrose 5 %-0.45 % sodium chloride infusion (has no administration in time range)  dextrose 50 % solution 0-50 mL (has no administration in time range)  potassium chloride 10 mEq in 100 mL IVPB (has no administration in time range)  sodium chloride flush (NS) 0.9 % injection 3 mL (3 mLs Intravenous Given 05/07/20 2017)  sodium chloride 0.9 % bolus 1,000 mL (1,000 mLs Intravenous New Bag/Given (Non-Interop) 05/07/20 2132)  iohexol (OMNIPAQUE) 300 MG/ML solution 100 mL (100 mLs Intravenous Contrast Given 05/07/20 2224)    ED Course  I have reviewed the triage vital signs and the nursing notes.  Pertinent labs & imaging results that were available during my care  of the patient were reviewed by me and considered in my medical decision making (see chart for details).    MDM Rules/Calculators/A&P                          Patient is a 64 year old male who presents with ongoing nausea, vomiting and difficulty eating over the last week and a half.  He also has associated abdominal pain.  CT scan does not show any acute abnormality.  There was some enlargement of his prostate although he does have known prostate cancer which he is getting treated for.  Chest x-ray is clear without evidence of pneumonia.  His Covid test is negative.  His labs show consistent findings with DKA.  His anion gap is elevated.  His CO2 is normal.  He does report that he was recently diagnosed with diabetes and he is on medication for it.  He says he takes pills but I do not see any on his list.  He does not know the name of it but he said it should be on his list.  He was given IV fluids and started on insulin.  I spoke with Dr. Myna Hidalgo who will admit the patient for further treatment.  Final Clinical Impression(s) / ED Diagnoses Final diagnoses:  Diabetic ketoacidosis without coma associated with type 2 diabetes mellitus [facility])    Rx / DC Orders ED Discharge Orders    None       Malvin Johns, MD 05/07/20 2356

## 2020-05-07 NOTE — ED Notes (Signed)
Date and time results received: 05/07/20 9:46 PM  Test: Glucose  Critical Value: 526  Name of Provider Notified: Lenor Derrick, MD  Orders Received? Or Actions Taken?:

## 2020-05-07 NOTE — ED Notes (Signed)
Pt transported to CT ?

## 2020-05-08 DIAGNOSIS — R739 Hyperglycemia, unspecified: Secondary | ICD-10-CM

## 2020-05-08 DIAGNOSIS — E875 Hyperkalemia: Secondary | ICD-10-CM | POA: Diagnosis present

## 2020-05-08 DIAGNOSIS — N390 Urinary tract infection, site not specified: Secondary | ICD-10-CM

## 2020-05-08 DIAGNOSIS — Z79899 Other long term (current) drug therapy: Secondary | ICD-10-CM | POA: Diagnosis not present

## 2020-05-08 DIAGNOSIS — I10 Essential (primary) hypertension: Secondary | ICD-10-CM | POA: Diagnosis not present

## 2020-05-08 DIAGNOSIS — Z20822 Contact with and (suspected) exposure to covid-19: Secondary | ICD-10-CM | POA: Diagnosis present

## 2020-05-08 DIAGNOSIS — N179 Acute kidney failure, unspecified: Secondary | ICD-10-CM | POA: Diagnosis not present

## 2020-05-08 DIAGNOSIS — E876 Hypokalemia: Secondary | ICD-10-CM | POA: Diagnosis present

## 2020-05-08 DIAGNOSIS — E1110 Type 2 diabetes mellitus with ketoacidosis without coma: Principal | ICD-10-CM

## 2020-05-08 DIAGNOSIS — K760 Fatty (change of) liver, not elsewhere classified: Secondary | ICD-10-CM | POA: Diagnosis present

## 2020-05-08 DIAGNOSIS — Z87891 Personal history of nicotine dependence: Secondary | ICD-10-CM | POA: Diagnosis not present

## 2020-05-08 DIAGNOSIS — Z85830 Personal history of malignant neoplasm of bone: Secondary | ICD-10-CM | POA: Diagnosis not present

## 2020-05-08 DIAGNOSIS — Z803 Family history of malignant neoplasm of breast: Secondary | ICD-10-CM | POA: Diagnosis not present

## 2020-05-08 DIAGNOSIS — E119 Type 2 diabetes mellitus without complications: Secondary | ICD-10-CM

## 2020-05-08 DIAGNOSIS — K449 Diaphragmatic hernia without obstruction or gangrene: Secondary | ICD-10-CM | POA: Diagnosis present

## 2020-05-08 DIAGNOSIS — C61 Malignant neoplasm of prostate: Secondary | ICD-10-CM | POA: Diagnosis present

## 2020-05-08 DIAGNOSIS — Z7952 Long term (current) use of systemic steroids: Secondary | ICD-10-CM | POA: Diagnosis not present

## 2020-05-08 DIAGNOSIS — N400 Enlarged prostate without lower urinary tract symptoms: Secondary | ICD-10-CM | POA: Diagnosis present

## 2020-05-08 DIAGNOSIS — Z832 Family history of diseases of the blood and blood-forming organs and certain disorders involving the immune mechanism: Secondary | ICD-10-CM | POA: Diagnosis not present

## 2020-05-08 LAB — BLOOD GAS, VENOUS
Acid-Base Excess: 3.1 mmol/L — ABNORMAL HIGH (ref 0.0–2.0)
Bicarbonate: 28.9 mmol/L — ABNORMAL HIGH (ref 20.0–28.0)
O2 Saturation: 30.2 %
Patient temperature: 98.6
pCO2, Ven: 50.5 mmHg (ref 44.0–60.0)
pH, Ven: 7.376 (ref 7.250–7.430)
pO2, Ven: 22.4 mmHg — CL (ref 32.0–45.0)

## 2020-05-08 LAB — BASIC METABOLIC PANEL
Anion gap: 14 (ref 5–15)
Anion gap: 11 (ref 5–15)
Anion gap: 13 (ref 5–15)
BUN: 19 mg/dL (ref 8–23)
BUN: 18 mg/dL (ref 8–23)
BUN: 20 mg/dL (ref 8–23)
CO2: 23 mmol/L (ref 22–32)
CO2: 28 mmol/L (ref 22–32)
CO2: 24 mmol/L (ref 22–32)
Calcium: 8.7 mg/dL — ABNORMAL LOW (ref 8.9–10.3)
Calcium: 8.7 mg/dL — ABNORMAL LOW (ref 8.9–10.3)
Calcium: 8.6 mg/dL — ABNORMAL LOW (ref 8.9–10.3)
Chloride: 96 mmol/L — ABNORMAL LOW (ref 98–111)
Chloride: 97 mmol/L — ABNORMAL LOW (ref 98–111)
Chloride: 94 mmol/L — ABNORMAL LOW (ref 98–111)
Creatinine, Ser: 0.88 mg/dL (ref 0.61–1.24)
Creatinine, Ser: 1 mg/dL (ref 0.61–1.24)
Creatinine, Ser: 0.88 mg/dL (ref 0.61–1.24)
GFR calc Af Amer: >60 mL/min (ref >60)
GFR calc Af Amer: >60 mL/min (ref >60)
GFR calc Af Amer: >60 mL/min (ref >60)
GFR calc non Af Amer: >60 mL/min (ref >60)
GFR calc non Af Amer: >60 mL/min (ref >60)
GFR calc non Af Amer: >60 mL/min (ref >60)
Glucose, Bld: 221 mg/dL — ABNORMAL HIGH (ref 70–99)
Glucose, Bld: 212 mg/dL — ABNORMAL HIGH (ref 70–99)
Glucose, Bld: 255 mg/dL — ABNORMAL HIGH (ref 70–99)
Potassium: 3.6 mmol/L (ref 3.5–5.1)
Potassium: 3.6 mmol/L (ref 3.5–5.1)
Potassium: 3.9 mmol/L (ref 3.5–5.1)
Sodium: 133 mmol/L — ABNORMAL LOW (ref 135–145)
Sodium: 136 mmol/L (ref 135–145)
Sodium: 131 mmol/L — ABNORMAL LOW (ref 135–145)

## 2020-05-08 LAB — GLUCOSE, CAPILLARY
Glucose-Capillary: 382 mg/dL — ABNORMAL HIGH (ref 70–99)
Glucose-Capillary: 243 mg/dL — ABNORMAL HIGH (ref 70–99)
Glucose-Capillary: 223 mg/dL — ABNORMAL HIGH (ref 70–99)
Glucose-Capillary: 215 mg/dL — ABNORMAL HIGH (ref 70–99)
Glucose-Capillary: 197 mg/dL — ABNORMAL HIGH (ref 70–99)
Glucose-Capillary: 219 mg/dL — ABNORMAL HIGH (ref 70–99)
Glucose-Capillary: 239 mg/dL — ABNORMAL HIGH (ref 70–99)
Glucose-Capillary: 187 mg/dL — ABNORMAL HIGH (ref 70–99)
Glucose-Capillary: 166 mg/dL — ABNORMAL HIGH (ref 70–99)
Glucose-Capillary: 232 mg/dL — ABNORMAL HIGH (ref 70–99)
Glucose-Capillary: 269 mg/dL — ABNORMAL HIGH (ref 70–99)
Glucose-Capillary: 261 mg/dL — ABNORMAL HIGH (ref 70–99)
Glucose-Capillary: 347 mg/dL — ABNORMAL HIGH (ref 70–99)

## 2020-05-08 LAB — HIV ANTIBODY (ROUTINE TESTING W REFLEX): HIV Screen 4th Generation wRfx: NONREACTIVE

## 2020-05-08 LAB — HEMOGLOBIN A1C
Hgb A1c MFr Bld: 14.4 % — ABNORMAL HIGH (ref 4.8–5.6)
Mean Plasma Glucose: 366.58 mg/dL

## 2020-05-08 LAB — BETA-HYDROXYBUTYRIC ACID: Beta-Hydroxybutyric Acid: 3.68 mmol/L — ABNORMAL HIGH (ref 0.05–0.27)

## 2020-05-08 LAB — MRSA PCR SCREENING: MRSA by PCR: POSITIVE — AB

## 2020-05-08 MED ORDER — MUPIROCIN 2 % EX OINT
1.0000 "application " | TOPICAL_OINTMENT | Freq: Two times a day (BID) | CUTANEOUS | Status: DC
  Administered 2020-05-08 – 2020-05-09 (×3): 1 via NASAL
  Filled 2020-05-08: qty 22

## 2020-05-08 MED ORDER — INSULIN ASPART 100 UNIT/ML ~~LOC~~ SOLN
0.0000 [IU] | Freq: Three times a day (TID) | SUBCUTANEOUS | Status: DC
  Administered 2020-05-08: 5 [IU] via SUBCUTANEOUS
  Administered 2020-05-09: 3 [IU] via SUBCUTANEOUS

## 2020-05-08 MED ORDER — POTASSIUM CHLORIDE CRYS ER 20 MEQ PO TBCR
40.0000 meq | EXTENDED_RELEASE_TABLET | Freq: Once | ORAL | Status: AC
  Administered 2020-05-08: 40 meq via ORAL
  Filled 2020-05-08: qty 2

## 2020-05-08 MED ORDER — TRAMADOL HCL 50 MG PO TABS: 50.0000 mg | ORAL_TABLET | Freq: Four times a day (QID) | ORAL | Status: DC | PRN

## 2020-05-08 MED ORDER — LIVING WELL WITH DIABETES BOOK
Freq: Once | Status: AC
  Filled 2020-05-08: qty 1

## 2020-05-08 MED ORDER — CHLORHEXIDINE GLUCONATE CLOTH 2 % EX PADS: 6.0000 | MEDICATED_PAD | Freq: Every day | CUTANEOUS | Status: DC

## 2020-05-08 MED ORDER — INSULIN DETEMIR 100 UNIT/ML ~~LOC~~ SOLN
30.0000 [IU] | Freq: Two times a day (BID) | SUBCUTANEOUS | Status: DC
  Administered 2020-05-08 – 2020-05-09 (×3): 30 [IU] via SUBCUTANEOUS
  Filled 2020-05-08 (×3): qty 0.3

## 2020-05-08 MED ORDER — PREDNISONE 5 MG PO TABS
5.0000 mg | ORAL_TABLET | Freq: Every day | ORAL | Status: DC
  Administered 2020-05-08 – 2020-05-09 (×2): 5 mg via ORAL
  Filled 2020-05-08 (×2): qty 1

## 2020-05-08 MED ORDER — MUPIROCIN 2 % EX OINT: TOPICAL_OINTMENT | Freq: Two times a day (BID) | CUTANEOUS | Status: DC

## 2020-05-08 MED ORDER — LATANOPROST 0.005 % OP SOLN
1.0000 [drp] | Freq: Every day | OPHTHALMIC | Status: DC
  Administered 2020-05-08 (×2): 1 [drp] via OPHTHALMIC
  Filled 2020-05-08: qty 2.5

## 2020-05-08 MED ORDER — CYCLOBENZAPRINE HCL 5 MG PO TABS: 5.0000 mg | ORAL_TABLET | Freq: Three times a day (TID) | ORAL | Status: DC | PRN

## 2020-05-08 MED ORDER — SODIUM CHLORIDE 0.9 % IV SOLN
2.0000 g | INTRAVENOUS | Status: DC
  Administered 2020-05-08: 2 g via INTRAVENOUS
  Filled 2020-05-08 (×2): qty 20

## 2020-05-08 MED ORDER — INSULIN REGULAR(HUMAN) IN NACL 100-0.9 UT/100ML-% IV SOLN: INTRAVENOUS | Status: DC

## 2020-05-08 MED ORDER — ABIRATERONE ACETATE 250 MG PO TABS: 1000.0000 mg | ORAL_TABLET | Freq: Every day | ORAL | Status: DC

## 2020-05-08 MED ORDER — ENOXAPARIN SODIUM 40 MG/0.4ML ~~LOC~~ SOLN
40.0000 mg | SUBCUTANEOUS | Status: DC
  Administered 2020-05-08: 40 mg via SUBCUTANEOUS
  Filled 2020-05-08: qty 0.4

## 2020-05-08 MED ORDER — SODIUM CHLORIDE 0.9 % IV SOLN
1.0000 g | INTRAVENOUS | Status: DC
  Filled 2020-05-08: qty 10

## 2020-05-08 MED ORDER — TAMSULOSIN HCL 0.4 MG PO CAPS
0.4000 mg | ORAL_CAPSULE | Freq: Every day | ORAL | Status: DC
  Administered 2020-05-08 – 2020-05-09 (×2): 0.4 mg via ORAL
  Filled 2020-05-08 (×2): qty 1

## 2020-05-08 MED ORDER — SODIUM CHLORIDE 0.9 % IV SOLN: INTRAVENOUS | Status: DC

## 2020-05-08 MED ORDER — DEXTROSE 50 % IV SOLN: 0.0000 mL | INTRAVENOUS | Status: DC | PRN

## 2020-05-08 MED ORDER — DEXTROSE-NACL 5-0.45 % IV SOLN: INTRAVENOUS | Status: DC

## 2020-05-08 MED ORDER — FINASTERIDE 5 MG PO TABS
5.0000 mg | ORAL_TABLET | Freq: Every day | ORAL | Status: DC
  Administered 2020-05-08 – 2020-05-09 (×2): 5 mg via ORAL
  Filled 2020-05-08 (×2): qty 1

## 2020-05-08 NOTE — Progress Notes (Signed)
Inpatient Diabetes Program Recommendations  AACE/ADA: New Consensus Statement on Inpatient Glycemic Control (2015)  Target Ranges:  Prepandial:   less than 140 mg/dL      Peak postprandial:   less than 180 mg/dL (1-2 hours)      Critically ill patients:  140 - 180 mg/dL   Lab Results  Component Value Date   GLUCAP 269 (H) 05/08/2020   HGBA1C 14.4 (H) 05/08/2020    Review of Glycemic Control  Diabetes history: DM2 Outpatient Diabetes medications: States he has been on metformin, but do not see any record of this Current orders for Inpatient glycemic control: Levemir 30 units bid, Novolog 0-9 units tidwc  HgbA1C - 14.4% Will be discharged on insulin  Inpatient Diabetes Program Recommendations:     Add Novolog HS correction Add Novolog 4 units tidwc for meal coverage insulin  Spoke with patient about new diabetes diagnosis.  Discussed A1C results (14.4%) and explained what an A1C is and informed patient that his current A1C indicates an average glucose of 350 mg/dl over the past 2-3 months. Discussed basic pathophysiology of DM Type 2, basic home care, importance of checking CBGs and maintaining good CBG control to prevent long-term and short-term complications. Reviewed glucose and A1C goals. Reviewed signs and symptoms of hyperglycemia and hypoglycemia along with treatment for both. Discussed impact of nutrition, exercise, stress, sickness, and medications on diabetes control. Reviewed Living Well with diabetes booklet and encouraged patient to read through entire book. Pt seems very motivated to control his blood sugars and will f/u with PCP for diabetes management.  Educated patient and spouse on insulin pen use at home. Reviewed contents of insulin flexpen starter kit. Reviewed all steps if insulin pen including attachment of needle, 2-unit air shot, dialing up dose, giving injection, removing needle, disposal of sharps, storage of unused insulin, disposal of insulin etc. Patient able  to provide successful return demonstration. Also reviewed troubleshooting with insulin pen. MD to give patient Rxs for insulin pens and insulin pen needles.  Will follow-up in am for any other concerns and questions.   Thank you. Lorenda Peck, RD, LDN, CDE Inpatient Diabetes Coordinator [phone]

## 2020-05-08 NOTE — ED Notes (Signed)
ED TO INPATIENT HANDOFF REPORT  Name/Age/Gender Terry Hinton 64 y.o. male  Code Status   Home/SNF/Other Home  Chief Complaint Uncontrolled diabetes mellitus with hyperglycemia ([place]) [E11.65]  Level of Care/Admitting Diagnosis ED Disposition    ED Disposition Condition [facility]: [place] [[id number]]  Level of Care: Stepdown [14]  Admit to SDU based on following criteria: Severe physiological/psychological symptoms:  Any diagnosis requiring assessment & intervention at least every 4 hours on an ongoing basis to obtain desired patient outcomes including stability and rehabilitation  Covid Evaluation: Confirmed COVID Negative  Diagnosis: Uncontrolled diabetes mellitus with hyperglycemia [facility]) [[id number]]  Admitting Physician: Vianne Bulls [[id number]]  Attending Physician: Vianne Bulls [[id number]]       Medical History Past Medical History:  Diagnosis Date   Allergy    Anemia    Arthritis    knee   Back pain    back, neck and leg problems, pt. stated   Enlarged prostate    pt. stated   Hypertension    Leg injury    Prostate cancer ([place])    Mets to Bone     Allergies No Known Allergies  IV Location/Drains/Wounds Patient Lines/Drains/Airways Status    Active Line/Drains/Airways    Name Placement date Placement time Site Days   Peripheral IV 05/07/20 Left Antecubital 05/07/20  2007  Antecubital  1   Peripheral IV 05/07/20 Right Antecubital 05/07/20  2352  Antecubital  1   Urethral Catheter Arsenio Katz Double-lumen 16 Fr. 12/17/17  0313  Double-lumen  873          Labs/Imaging Results for orders placed or performed during the hospital encounter of 05/07/20 (from the past 48 hour(s))  Lipase, blood     Status: None   Collection Time: 05/07/20  4:00 PM  Result Value Ref Range   Lipase 26 11 - 51 U/L    Comment: Performed at [facility], [place] [address] [place], [place] [address]   Comprehensive metabolic panel     Status: Abnormal   Collection Time: 05/07/20  4:00 PM  Result Value Ref Range   Sodium 131 (L) 135 - 145 mmol/L   Potassium 4.2 3.5 - 5.1 mmol/L   Chloride 87 (L) 98 - 111 mmol/L   CO2 24 22 - 32 mmol/L   Glucose, Bld 526 (HH) 70 - 99 mg/dL    Comment: Glucose reference range applies only to samples taken after fasting for at least 8 hours. CRITICAL RESULT CALLED TO, READ BACK BY AND VERIFIED WITH: Baldo Ash @ 2146 05/07/2020 PERRY, J.    BUN 25 (H) 8 - 23 mg/dL   Creatinine, Ser 1.32 (H) 0.61 - 1.24 mg/dL   Calcium 9.3 8.9 - 10.3 mg/dL   Total Protein 7.6 6.5 - 8.1 g/dL   Albumin 4.5 3.5 - 5.0 g/dL   AST 17 15 - 41 U/L   ALT 23 0 - 44 U/L   Alkaline Phosphatase 215 (H) 38 - 126 U/L   Total Bilirubin 1.7 (H) 0.3 - 1.2 mg/dL   GFR calc non Af Amer 57 (L) >60 mL/min   GFR calc Af Amer >60 >60 mL/min   Anion gap 20 (H) 5 - 15    Comment: Performed at [facility], [place] [address] [place], [place] [address]  CBC     Status: Abnormal   Collection Time: 05/07/20  4:00 PM  Result Value Ref Range   WBC 11.3 (  H) 4.0 - 10.5 K/uL   RBC 5.28 4.22 - 5.81 MIL/uL   Hemoglobin 15.7 13.0 - 17.0 g/dL   HCT 45.9 39 - 52 %   MCV 86.9 80.0 - 100.0 fL   MCH 29.7 26.0 - 34.0 pg   MCHC 34.2 30.0 - 36.0 g/dL   RDW 11.4 (L) 11.5 - 15.5 %   Platelets 163 150 - 400 K/uL   nRBC 0.0 0.0 - 0.2 %    Comment: Performed at [facility], [place] [address], [place], [place] [address]  Urinalysis, Routine w reflex microscopic     Status: Abnormal   Collection Time: 05/07/20  8:18 PM  Result Value Ref Range   Color, Urine YELLOW YELLOW   APPearance HAZY (A) CLEAR   Specific Gravity, Urine 1.036 (H) 1.005 - 1.030   pH 5.0 5.0 - 8.0   Glucose, UA >=500 (A) NEGATIVE mg/dL   Hgb urine dipstick SMALL (A) NEGATIVE   Bilirubin Urine NEGATIVE NEGATIVE   Ketones, ur 80 (A) NEGATIVE mg/dL   Protein, ur NEGATIVE NEGATIVE mg/dL   Nitrite  NEGATIVE NEGATIVE   Leukocytes,Ua SMALL (A) NEGATIVE   RBC / HPF 0-5 0 - 5 RBC/hpf   WBC, UA >50 (H) 0 - 5 WBC/hpf   Bacteria, UA RARE (A) NONE SEEN   WBC Clumps PRESENT    Mucus PRESENT     Comment: Performed at [facility], [place] [address], [place], [place] [address]  SARS Coronavirus 2 by RT PCR (hospital order, performed in [facility] hospital lab) Nasopharyngeal Nasopharyngeal Swab     Status: None   Collection Time: 05/07/20  8:46 PM   Specimen: Nasopharyngeal Swab  Result Value Ref Range   SARS Coronavirus 2 NEGATIVE NEGATIVE    Comment: (NOTE) SARS-CoV-2 target nucleic acids are NOT DETECTED.  The SARS-CoV-2 RNA is generally detectable in upper and lower respiratory specimens during the acute phase of infection. The lowest concentration of SARS-CoV-2 viral copies this assay can detect is 250 copies / mL. A negative result does not preclude SARS-CoV-2 infection and should not be used as the sole basis for treatment or other patient management decisions.  A negative result may occur with improper specimen collection / handling, submission of specimen other than nasopharyngeal swab, presence of viral mutation(s) within the areas targeted by this assay, and inadequate number of viral copies (<250 copies / mL). A negative result must be combined with clinical observations, patient history, and epidemiological information.  Fact Sheet for Patients:   StrictlyIdeas.no  Fact Sheet for Healthcare Providers: BankingDealers.co.za  This test is not yet approved or  cleared by the [place] FDA and has been authorized for detection and/or diagnosis of SARS-CoV-2 by FDA under an Emergency Use Authorization (EUA).  This EUA will remain in effect (meaning this test can be used) for the duration of the COVID-19 declaration under Section 564(b)(1) of the Act, 21 U.S.C. section 360bbb-3(b)(1), unless the authorization  is terminated or revoked sooner.  Performed at [facility], [place] [address], [place], [place] [address]   Beta-hydroxybutyric acid     Status: Abnormal   Collection Time: 05/07/20 11:32 PM  Result Value Ref Range   Beta-Hydroxybutyric Acid 3.68 (H) 0.05 - 0.27 mmol/L    Comment: Performed at [facility], [place] [address], [place], [place] [address]  Blood gas, venous     Status: Abnormal   Collection Time: 05/07/20 11:32 PM  Result Value Ref Range  pH, Ven 7.376 7.25 - 7.43   pCO2, Ven 50.5 44 - 60 mmHg   pO2, Ven 22.4 (LL) 32 - 45 mmHg    Comment: CRITICAL RESULT CALLED TO, READ BACK BY AND VERIFIED WITH: HODGES, I. @ 0626 05/08/2020 PERRY, J.    Bicarbonate 28.9 (H) 20.0 - 28.0 mmol/L   Acid-Base Excess 3.1 (H) 0.0 - 2.0 mmol/L   O2 Saturation 30.2 %   Patient temperature 98.6     Comment: Performed at [facility], [place] [address] [place], [place] [address]  CBG monitoring, ED     Status: Abnormal   Collection Time: 05/07/20 11:55 PM  Result Value Ref Range   Glucose-Capillary 377 (H) 70 - 99 mg/dL    Comment: Glucose reference range applies only to samples taken after fasting for at least 8 hours.   DG Chest 2 View  Result Date: 05/07/2020 CLINICAL DATA:  Cough, weakness, dizziness EXAM: CHEST - 2 VIEW COMPARISON:  None. FINDINGS: The heart size and mediastinal contours are within normal limits. The left hemidiaphragm is obscured which may reflect a trace left pleural effusion or pericardial fat. The right lung is clear. There is no right pleural effusion. There is no pneumothorax. 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 IMPRESSION: Obscuration of the left hemidiaphragm may reflect trace left pleural effusion versus pericardial fat. Electronically Signed   By: Zerita Boers M.D.   On: 05/07/2020 20:47   CT Abdomen Pelvis W Contrast  Result Date: 05/07/2020 CLINICAL DATA:  Weight  loss and difficulty eating. History of prostate cancer. EXAM: CT ABDOMEN AND PELVIS WITH CONTRAST TECHNIQUE: Multidetector CT imaging of the abdomen and pelvis was performed using the standard protocol following bolus administration of intravenous contrast. CONTRAST:  120mL OMNIPAQUE IOHEXOL 300 MG/ML  SOLN COMPARISON:  December 25, 2017 FINDINGS: Lower chest: No acute abnormality. Hepatobiliary: There is diffuse fatty infiltration of the liver parenchyma. A 9 mm focus of parenchymal low attenuation is seen within the anteromedial aspect of the right lobe of the liver. No gallstones, gallbladder wall thickening, or biliary dilatation. Pancreas: Unremarkable. No pancreatic ductal dilatation or surrounding inflammatory changes. Spleen: Normal in size without focal abnormality. Adrenals/Urinary Tract: Adrenal glands are unremarkable. Kidneys are normal, without renal calculi, focal lesion, or hydronephrosis. Bladder is unremarkable. Stomach/Bowel: There is a small hiatal hernia. A 2.3 cm x 2.0 cm gastric diverticulum is seen along the posterior aspect of the gastric fundus. Appendix appears normal. No evidence of bowel wall thickening, distention, or inflammatory changes. Vascular/Lymphatic: No significant vascular findings are present. No enlarged abdominal or pelvic lymph nodes. Reproductive: The prostate gland is mildly enlarged. A mild amount of mass effect is seen along the base of the urinary bladder. Other: No abdominal wall hernia or abnormality. No abdominopelvic ascites. Musculoskeletal: Marked severity degenerative changes are seen at the level of L5-S1. Mild diffuse sclerotic changes are again seen involving the right iliac bone. IMPRESSION: 1. Small hiatal hernia. 2. Hepatic steatosis. 3. Small gastric diverticulum. 4. 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 5. Mildly enlarged prostate gland with mass effect along  the base of the urinary bladder. Further evaluation is recommended, as recurring neoplasm cannot be excluded. Electronically Signed   By: Virgina Norfolk M.D.   On: 05/07/2020 22:51    Pending Labs [employer] (From admission, onward) Comment          Start  Ordered   05/07/20 2332  Beta-hydroxybutyric acid  (Diabetes Ketoacidosis (DKA))  Now then every 8 hours,   STAT      05/07/20 2333   Signed and Held  HIV Antibody (routine testing w rflx)  (HIV Antibody (Routine testing w reflex) panel)  Once,   R        Signed and Held   Signed and Held  Creatinine, serum  (enoxaparin (LOVENOX)    CrCl >/= 30 ml/min)  Weekly,   R     Comments: while on enoxaparin therapy    Signed and Held   Signed and Held  Basic metabolic panel  (Hyperglycemia (not DKA or HHS))  Daily,   STAT      Signed and Held   Signed and Held  Hemoglobin A1c  (Hyperglycemia (not DKA or HHS))  Once,   R       Comments: To assess prior glycemic control.    Signed and Held          Vitals/Pain Today's Vitals   05/07/20 2200 05/07/20 2300 05/07/20 2359 05/08/20 0030  BP: 132/86 (!) 134/96  (!) 132/102  Pulse: 83 88  93  Resp: 16 16  (!) 21  Temp:      TempSrc:      SpO2: 93% 96%  99%  Weight:   111.4 kg   Height:   6\' 8"  (2.032 m)   PainSc:        Isolation Precautions No active isolations  Medications Medications  sodium chloride (PF) 0.9 % injection (has no administration in time range)  insulin regular, human (MYXREDLIN) 100 units/ 100 mL infusion (6.5 Units/hr Intravenous New Bag/Given 05/08/20 0012)  0.9 %  sodium chloride infusion ( Intravenous New Bag/Given 05/07/20 2358)  dextrose 5 %-0.45 % sodium chloride infusion (0 mLs Intravenous Hold 05/08/20 0019)  dextrose 50 % solution 0-50 mL (has no administration in time range)  potassium chloride 10 mEq in 100 mL IVPB (10 mEq Intravenous New Bag/Given 05/07/20 2359)  sodium chloride flush (NS) 0.9 % injection 3 mL (3 mLs Intravenous Given 05/07/20 2017)   sodium chloride 0.9 % bolus 1,000 mL (1,000 mLs Intravenous New Bag/Given (Non-Interop) 05/07/20 2132)  iohexol (OMNIPAQUE) 300 MG/ML solution 100 mL (100 mLs Intravenous Contrast Given 05/07/20 2224)  sodium chloride 0.9 % bolus 1,000 mL (1,000 mLs Intravenous New Bag/Given (Non-Interop) 05/07/20 2357)    Mobility walks

## 2020-05-08 NOTE — Progress Notes (Addendum)
PROGRESS NOTE    Terry Hinton  TFT:[id number] DOB: 11/11/1955 DOA: 05/07/2020 PCP: Tsosie Billing, MD (Inactive)    Brief Narrative:  Patient was admitted to the hospital with a working diagnosis of diabetes ketoacidosis.  64 year old male with past medical history for prostate cancer and hypertension.  Reported 2 weeks of generalized malaise, poor appetite, weight loss, recently abdominal discomfort, nausea and vomiting.  Positive polyuria and polydipsia.  Recently diagnosed with diabetes mellitus and placed on oral agents. On his initial physical examination blood pressure 122/96, heart rate 94, respirate 14, oxygen saturation 98%, dry mucous membranes, lungs clear to auscultation bilaterally, heart S1-S2, present rhythm, soft abdomen, no lower extremity edema. Sodium 131, potassium 4.2, chloride 87, bicarb 24, glucose 526, BUN 25 creatinine 1.32, calcium 9.3, anion gap 20, lipase 26, AST 17, ALT 23, white count 11.3, hemoglobin 15.7, hematocrit 45.9, platelets 163.  SARS COVID-19 negative.  Urinalysis with > 500 glucose, specific gravity 1.036, > 50 white cells. CT of the abdomen with small hiatal hernia, hepatic steatosis.  Enlarged prostate. Chest radiograph with no infiltrates. EKG 90 bpm, left axis deviation, normal intervals, QTC 439, sinus rhythm, no ST segment or T wave changes, poor R wave progression.  Assessment & Plan:   Principal Problem:   DKA (diabetic ketoacidoses) ([place]) Active Problems:   Hyperglycemia   Prostate cancer ([place])   Hypertension   Acute lower UTI   T2DM (type 2 diabetes mellitus) ([place])   AKI (acute kidney injury) ([place])   1. DKA, uncontrolled T2DM. Patient with no nausea or vomiting this am, anion gap has closed. Over last 3 h patient has used about 12 units of insulin, calculated 24 H requirement 96 units.  Transition patient to sq insulin regimen with 30 units bid and will add insulin sliding scale for glucose cover and monitoring. Will need  diabetic teaching and close follow up as outpatient.   Follow up BMP at 16:00 hrs.  2. BPH/ prostate cancer complicated with urine infection (presetn on admission). Will add antibiotic therapy with Ceftriaxone. Follow up op on cell count, temperature curve and cultures.  Will continue finesteride, tamsulosin, zytiga and prednisone.   3, HTN. Blood pressure this am 145/89. Will continue close monitoring.   4. AKI with hypokalemia. Renal function with serum cr down to 1,0 with K at 3,6 and serum bicarbonate at 28.   Will continue close monitoring of renal function, advance diet as tolerated, will add 40 meq Kcl po x1. Follow renal panel and electrolytes in am.   Patient continue to be at high risk for worsening DKA   Status is: Observation  The patient will require care spanning > 2 midnights and should be moved to inpatient because: IV treatments appropriate due to intensity of illness or inability to take PO  Dispo: The patient is from: Home              Anticipated d/c is to: Home              Anticipated d/c date is: 2 days              Patient currently is not medically stable to d/c.   DVT prophylaxis: Enoxaparin   Code Status:   full  Family Communication:  No family at the bedside       Subjective: Patient is feeling better but not yet back to baseline, continue to have generalized weakness, but no further nausea or vomiting.   Objective: Vitals:   05/08/20  0400 05/08/20 0500 05/08/20 0700 05/08/20 0800  BP: (!) 133/100 (!) 156/96 (!) 172/89 (!) 133/91  Pulse: 74 68 (!) 54 70  Resp: 17 16 12 16   Temp:    99 F (37.2 C)  TempSrc:    Axillary  SpO2: 94% 100% 100% 97%  Weight:      Height:        Intake/Output Summary (Last 24 hours) at 05/08/2020 0914 Last data filed at 05/08/2020 0440 Gross per 24 hour  Intake 441.48 ml  Output 150 ml  Net 291.48 ml   Filed Weights   05/07/20 2359  Weight: 111.4 kg    Examination:   General: Not in pain or dyspnea.  Deconditioned  Neurology: Awake and alert, non focal  E ENT: mild pallor, no icterus, oral mucosa moist Cardiovascular: No JVD. S1-S2 present, rhythmic, no gallops, rubs, or murmurs. No lower extremity edema. Pulmonary: positive breath sounds bilaterally, adequate air movement, no wheezing, rhonchi or rales. Gastrointestinal. Abdomen protuberant with, no organomegaly, non tender, no rebound or guarding Skin. No rashes Musculoskeletal: no joint deformities     Data Reviewed: I have personally reviewed following labs and imaging studies  CBC: Recent Labs  Lab 05/07/20 1600  WBC 11.3*  HGB 15.7  HCT 45.9  MCV 86.9  PLT 628   Basic Metabolic Panel: Recent Labs  Lab 05/07/20 1600 05/08/20 0320 05/08/20 0734  NA 131* 133* 136  K 4.2 3.6 3.6  CL 87* 96* 97*  CO2 24 23 28   GLUCOSE 526* 221* 212*  BUN 25* 19 18  CREATININE 1.32* 0.88 1.00  CALCIUM 9.3 8.7* 8.7*   GFR: Estimated Creatinine Clearance: 101.3 mL/min (by C-G formula based on SCr of 1 mg/dL). Liver Function Tests: Recent Labs  Lab 05/07/20 1600  AST 17  ALT 23  ALKPHOS 215*  BILITOT 1.7*  PROT 7.6  ALBUMIN 4.5   Recent Labs  Lab 05/07/20 1600  LIPASE 26   No results for input(s): AMMONIA in the last 168 hours. Coagulation Profile: No results for input(s): INR, PROTIME in the last 168 hours. Cardiac Enzymes: No results for input(s): CKTOTAL, CKMB, CKMBINDEX, TROPONINI in the last 168 hours. BNP (last 3 results) No results for input(s): PROBNP in the last 8760 hours. HbA1C: Recent Labs    05/08/20 0320  HGBA1C 14.4*   CBG: Recent Labs  Lab 05/08/20 0432 05/08/20 0601 05/08/20 0650 05/08/20 0807 05/08/20 0900  GLUCAP 215* 197* 219* 239* 187*   Lipid Profile: No results for input(s): CHOL, HDL, LDLCALC, TRIG, CHOLHDL, LDLDIRECT in the last 72 hours. Thyroid Function Tests: No results for input(s): TSH, T4TOTAL, FREET4, T3FREE, THYROIDAB in the last 72 hours. Anemia Panel: No results  for input(s): VITAMINB12, FOLATE, FERRITIN, TIBC, IRON, RETICCTPCT in the last 72 hours.    Radiology Studies: I have reviewed all of the imaging during this hospital visit personally     Scheduled Meds: . abiraterone acetate  1,000 mg Oral QAC breakfast  . Chlorhexidine Gluconate Cloth  6 each Topical Daily  . enoxaparin (LOVENOX) injection  40 mg Subcutaneous Q24H  . finasteride  5 mg Oral Daily  . latanoprost  1 drop Both Eyes QHS  . mupirocin ointment  1 application Nasal BID  . predniSONE  5 mg Oral Q breakfast  . tamsulosin  0.4 mg Oral Daily   Continuous Infusions: . sodium chloride Stopped (05/08/20 0223)  . dextrose 5 % and 0.45% NaCl 75 mL/hr at 05/08/20 0400  . insulin 4.6 Units/hr (  05/08/20 0811)     LOS: 0 days        Maher Shon Gerome Apley, MD

## 2020-05-08 NOTE — H&P (Signed)
History and Physical    Terry Hinton CHE:[id number] DOB: Oct 14, 1956 DOA: 05/07/2020  PCP: Tsosie Billing, MD (Inactive)   Patient coming from: Home   Chief Complaint: Poor appetite, weight loss,   HPI: Terry Hinton is a 63 y.o. 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  He has also been urinating more than usual and has had a dry mouth.  Patient believes that he was diagnosed with diabetes within the past few months and started on an oral medication for that which she reports to be taking but is not on his medication list and he does not recall the name of it.  Beginning 2 weeks ago, he has had a poor appetite and general malaise and has lost some weight unintentionally over this interval and also gone on to develop abdominal discomfort that is generalized, as well as nausea with nonbloody vomiting.  He denies any headache, chest pain, fevers, chills, melena, or hematochezia.  ED Course: Upon arrival to the ED, patient is found to be afebrile, saturating well on room air, and with stable blood pressure.  EKG features a sinus rhythm and chest x-ray with obscured left hemidiaphragm, likely from a trace pleural effusion or pericardial fat.  CT of the abdomen pelvis features a small hiatal hernia, small gastric diverticulum, mild sclerotic changes to the right iliac bone, and mildly enlarged prostate gland with mass-effect on the urinary bladder.  Chemistry panel is concerning for a glucose of 526, normal bicarbonate, anion gap 20, and creatinine 1.32, up from 0.9-1 last year.  CBC features a mild leukocytosis.  Urinalysis with glucosuria and ketonuria.  COVID-19 screening test is negative.  Patient was given 2 L normal saline, 20 mEq IV potassium, and started  on insulin infusion in the ED.  Review of Systems:  All other systems reviewed and apart from HPI, are negative.  Past Medical History:  Diagnosis Date  . Allergy   . Anemia   . Arthritis    knee  . Back pain    back, neck and leg problems, pt. stated  . Enlarged prostate    pt. stated  . Hypertension   . Leg injury   . Prostate cancer ([place])    Mets to Bone     Past Surgical History:  Procedure Laterality Date  . NO PAST SURGERIES    . PROSTATE BIOPSY       reports that he quit smoking about 6 years ago. His smoking use included cigarettes. He has never used smokeless tobacco. He reports current alcohol use. He reports that he does not use drugs.  No Known Allergies  Family History  Problem Relation Age of Onset  . Breast cancer Mother   . Sickle cell anemia Sister   . Colon cancer Neg Hx   . Colon polyps Neg Hx   . Esophageal cancer Neg Hx   . Rectal cancer Neg Hx   . Stomach cancer Neg Hx      Prior to Admission medications   Medication Sig Start Date End Date Taking? Authorizing Provider  abiraterone acetate (ZYTIGA) 250 MG tablet TAKE 4 TABLETS (1,000 MG TOTAL) BY MOUTH DAILY. TAKE ON AN EMPTY STOMACH 1 HOUR BEFORE OR 2 HOURS AFTER A MEAL 04/13/20  Yes Wyatt Portela, MD  cholecalciferol (VITAMIN D) 1000 units tablet  Take 1,000 Units by mouth daily.   Yes [provider]  cyclobenzaprine (FLEXERIL) 5 MG tablet Take 5 mg by mouth 3 (three) times daily as needed for muscle spasms.   Yes [provider]  finasteride (PROSCAR) 5 MG tablet Take 5 mg by mouth daily.   Yes [provider]  latanoprost (XALATAN) 0.005 % ophthalmic solution Place 1 drop into both eyes at bedtime.   Yes [provider]  lisinopril-hydrochlorothiazide (PRINZIDE,ZESTORETIC) 10-12.5 MG tablet Take 1 tablet by mouth daily.   Yes [provider]  OVER THE COUNTER MEDICATION Take 1 tablet by mouth daily.   Yes [provider]  potassium  chloride SA (K-DUR,KLOR-CON) 20 MEQ tablet Take 1 tablet (20 mEq total) by mouth daily. 08/21/18  Yes Wyatt Portela, MD  predniSONE (DELTASONE) 5 MG tablet Take 1 tablet (5 mg total) by mouth daily with breakfast. 01/14/18  Yes Wyatt Portela, MD  tamsulosin (FLOMAX) 0.4 MG CAPS capsule Take 0.4 mg by mouth daily.  02/05/18  Yes [provider]  traMADol (ULTRAM) 50 MG tablet Take 50 mg by mouth every 6 (six) hours as needed (Pain).   Yes [provider]    Physical Exam: Vitals:   05/07/20 1558 05/07/20 2017 05/07/20 2200 05/07/20 2359  BP: (!) 129/114 (!) 122/96 132/86   Pulse: 72 94 83   Resp: 18 14 16    Temp: 97.8 F (36.6 C)     TempSrc: Oral     SpO2: 98% 98% 93%   Weight:    111.4 kg  Height: 6\' 8"  (2.032 m)   6\' 8"  (2.032 m)    Constitutional: NAD, calm  Eyes: PERTLA, lids and conjunctivae normal ENMT: Mucous membranes are moist. Posterior pharynx clear of any exudate or lesions.   Neck: normal, supple, no masses, no thyromegaly Respiratory:  no wheezing, no crackles. No accessory muscle use.  Cardiovascular: S1 & S2 heard, regular rate and rhythm. No extremity edema.   Abdomen: Soft, mild tenderness in mid- and upper abdomen without rebound pain or guarding. Bowel sounds active.  Musculoskeletal: no clubbing / cyanosis. No joint deformity upper and lower extremities.   Skin: no significant rashes, lesions, ulcers. Warm, dry, well-perfused. Neurologic: CN 2-12 grossly intact. Sensation intact. Moving all extremities.  Psychiatric: Alert and oriented to person, place, and situation. Calm and cooperative.    Labs and Imaging on Admission: I have personally reviewed following labs and imaging studies  CBC: Recent Labs  Lab 05/07/20 1600  WBC 11.3*  HGB 15.7  HCT 45.9  MCV 86.9  PLT 361   Basic Metabolic Panel: Recent Labs  Lab 05/07/20 1600  NA 131*  K 4.2  CL 87*  CO2 24  GLUCOSE 526*  BUN 25*  CREATININE 1.32*  CALCIUM 9.3    GFR: Estimated Creatinine Clearance: 76.8 mL/min (A) (by C-G formula based on SCr of 1.32 mg/dL (H)). Liver Function Tests: Recent Labs  Lab 05/07/20 1600  AST 17  ALT 23  ALKPHOS 215*  BILITOT 1.7*  PROT 7.6  ALBUMIN 4.5   Recent Labs  Lab 05/07/20 1600  LIPASE 26   No results for input(s): AMMONIA in the last 168 hours. Coagulation Profile: No results for input(s): INR, PROTIME in the last 168 hours. Cardiac Enzymes: No results for input(s): CKTOTAL, CKMB, CKMBINDEX, TROPONINI in the last 168 hours. BNP (last 3 results) No results for input(s): PROBNP in the last 8760 hours. HbA1C: No results for input(s): HGBA1C in the  last 72 hours. CBG: Recent Labs  Lab 05/07/20 2355  GLUCAP 377*   Lipid Profile: No results for input(s): CHOL, HDL, LDLCALC, TRIG, CHOLHDL, LDLDIRECT in the last 72 hours. Thyroid Function Tests: No results for input(s): TSH, T4TOTAL, FREET4, T3FREE, THYROIDAB in the last 72 hours. Anemia Panel: No results for input(s): VITAMINB12, FOLATE, FERRITIN, TIBC, IRON, RETICCTPCT in the last 72 hours. Urine analysis:    Component Value Date/Time   COLORURINE YELLOW 05/07/2020 2018   APPEARANCEUR HAZY (A) 05/07/2020 2018   LABSPEC 1.036 (H) 05/07/2020 2018   PHURINE 5.0 05/07/2020 2018   GLUCOSEU >=500 (A) 05/07/2020 2018   HGBUR SMALL (A) 05/07/2020 2018   BILIRUBINUR NEGATIVE 05/07/2020 2018   KETONESUR 80 (A) 05/07/2020 2018   PROTEINUR NEGATIVE 05/07/2020 2018   NITRITE NEGATIVE 05/07/2020 2018   LEUKOCYTESUR SMALL (A) 05/07/2020 2018   Sepsis Labs: @LABRCNTIP (procalcitonin:4,lacticidven:4) ) Recent Results (from the past 240 hour(s))  SARS Coronavirus 2 by RT PCR (hospital order, performed in [facility] hospital lab) Nasopharyngeal Nasopharyngeal Swab     Status: None   Collection Time: 05/07/20  8:46 PM   Specimen: Nasopharyngeal Swab  Result Value Ref Range Status   SARS Coronavirus 2 NEGATIVE NEGATIVE Final    Comment:  (NOTE) SARS-CoV-2 target nucleic acids are NOT DETECTED.  The SARS-CoV-2 RNA is generally detectable in upper and lower respiratory specimens during the acute phase of infection. The lowest concentration of SARS-CoV-2 viral copies this assay can detect is 250 copies / mL. A negative result does not preclude SARS-CoV-2 infection and should not be used as the sole basis for treatment or other patient management decisions.  A negative result may occur with improper specimen collection / handling, submission of specimen other than nasopharyngeal swab, presence of viral mutation(s) within the areas targeted by this assay, and inadequate number of viral copies (<250 copies / mL). A negative result must be combined with clinical observations, patient history, and epidemiological information.  Fact Sheet for Patients:   StrictlyIdeas.no  Fact Sheet for Healthcare Providers: BankingDealers.co.za  This test is not yet approved or  cleared by the [place] FDA and has been authorized for detection and/or diagnosis of SARS-CoV-2 by FDA under an Emergency Use Authorization (EUA).  This EUA will remain in effect (meaning this test can be used) for the duration of the COVID-19 declaration under Section 564(b)(1) of the Act, 21 U.S.C. section 360bbb-3(b)(1), unless the authorization is terminated or revoked sooner.  Performed at [facility], [place] [address] [place], [place] [address]      Radiological Exams on Admission: DG Chest 2 View  Result Date: 05/07/2020 CLINICAL DATA:  Cough, weakness, dizziness EXAM: CHEST - 2 VIEW COMPARISON:  None. FINDINGS: The heart size and mediastinal contours are within normal limits. The left hemidiaphragm is obscured which may reflect a trace left pleural effusion or pericardial fat. The right lung is clear. There is no right pleural effusion. There is no pneumothorax. 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 IMPRESSION: Obscuration of the left hemidiaphragm may reflect trace left pleural effusion versus pericardial fat. Electronically Signed   By: Zerita Boers M.D.   On: 05/07/2020 20:47   CT Abdomen Pelvis W Contrast  Result Date: 05/07/2020 CLINICAL DATA:  Weight loss and difficulty eating. History of prostate cancer. EXAM: CT ABDOMEN AND PELVIS WITH CONTRAST TECHNIQUE: Multidetector CT imaging of the abdomen and pelvis was performed using the standard protocol following bolus administration of  intravenous contrast. CONTRAST:  171mL OMNIPAQUE IOHEXOL 300 MG/ML  SOLN COMPARISON:  December 25, 2017 FINDINGS: Lower chest: No acute abnormality. Hepatobiliary: There is diffuse fatty infiltration of the liver parenchyma. A 9 mm focus of parenchymal low attenuation is seen within the anteromedial aspect of the right lobe of the liver. No gallstones, gallbladder wall thickening, or biliary dilatation. Pancreas: Unremarkable. No pancreatic ductal dilatation or surrounding inflammatory changes. Spleen: Normal in size without focal abnormality. Adrenals/Urinary Tract: Adrenal glands are unremarkable. Kidneys are normal, without renal calculi, focal lesion, or hydronephrosis. Bladder is unremarkable. Stomach/Bowel: There is a small hiatal hernia. A 2.3 cm x 2.0 cm gastric diverticulum is seen along the posterior aspect of the gastric fundus. Appendix appears normal. No evidence of bowel wall thickening, distention, or inflammatory changes. Vascular/Lymphatic: No significant vascular findings are present. No enlarged abdominal or pelvic lymph nodes. Reproductive: The prostate gland is mildly enlarged. A mild amount of mass effect is seen along the base of the urinary bladder. Other: No abdominal wall hernia or abnormality. No abdominopelvic ascites. Musculoskeletal: Marked severity degenerative changes are seen at the level of L5-S1. Mild diffuse sclerotic changes are  again seen involving the right iliac bone. IMPRESSION: 1. Small hiatal hernia. 2. Hepatic steatosis. 3. Small gastric diverticulum. 4. 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 5. Mildly enlarged prostate gland with mass effect along the base of the urinary bladder. Further evaluation is recommended, as recurring neoplasm cannot be excluded. Electronically Signed   By: Virgina Norfolk M.D.   On: 05/07/2020 22:51    EKG: Independently reviewed. Sinus rhythm.   Assessment/Plan   1. Hyperglycemia  - Presents with 2 weeks of progressive malaise and poor appetite with weight-loss, and now also abdominal discomfort, N/V, and polyuria, and is found to have serum glucose of 526 with normal bicarbonate  - Patient believes he was diagnosed with DM within the last few months and started on an oral medication for it but he unable to recall the name of medication and there is no documentation of this in our EMR  - New T2DM vs secondary to prednisone and Xgeva  - He was given 2 liters IVF in ED and started on insulin infusion  - Continue IVF, continue insulin infusion with frequent CBGs and serial chem panels, check A1c   2. Mild renal insufficiency  - SCr is 1.32 in ED, up from 0.9-1 range in 2020  - Increase could be acute given his recent poor appetite with N/V and polyuria  - Hold lisinopril-HCTZ, continue IVF hydration, repeat chem panel in am    3. Hypertension  - BP at goal  - Hold lisinopril-HCTZ initially given his hypovolemia with increased creatinine   4. Prostate cancer  - Continue Xgeva and low-dose prednisone, continue oncology follow-up after discharge    DVT prophylaxis: Lovenox  Code Status: Full  Family Communication: Discussed with patient  Disposition Plan:  Patient is from: Home  Anticipated d/c is to: Home  Anticipated d/c date is: 7/5 or 05/09/20 Patient currently: Requiring  treatment for uncontrolled blood sugars  Consults called: None  Admission status: Observation     Vianne Bulls, MD [facility] Pager: See [email or web address]  If 7AM-7PM, please contact the daytime attending [email or web address]  05/08/2020, 12:12 AM

## 2020-05-09 DIAGNOSIS — C61 Malignant neoplasm of prostate: Secondary | ICD-10-CM

## 2020-05-09 LAB — GLUCOSE, CAPILLARY: Glucose-Capillary: 248 mg/dL — ABNORMAL HIGH (ref 70–99)

## 2020-05-09 MED ORDER — BLOOD GLUCOSE METER KIT: PACK | 0 refills | Status: AC

## 2020-05-09 MED ORDER — INSULIN ASPART 100 UNIT/ML ~~LOC~~ SOLN: SUBCUTANEOUS | 11 refills | Status: DC

## 2020-05-09 MED ORDER — "INSULIN SYRINGE 29G X 1/2"" 0.5 ML MISC": 0.5000 mL | Freq: Every day | 5 refills | Status: AC

## 2020-05-09 MED ORDER — INSULIN DETEMIR 100 UNIT/ML ~~LOC~~ SOLN: 30.0000 [IU] | Freq: Two times a day (BID) | SUBCUTANEOUS | 11 refills | Status: AC

## 2020-05-09 MED ORDER — AMOXICILLIN-POT CLAVULANATE 875-125 MG PO TABS: 1.0000 | ORAL_TABLET | Freq: Two times a day (BID) | ORAL | 0 refills | Status: AC

## 2020-05-09 MED ORDER — AMOXICILLIN-POT CLAVULANATE 875-125 MG PO TABS
1.0000 | ORAL_TABLET | Freq: Two times a day (BID) | ORAL | Status: DC
  Administered 2020-05-09: 1 via ORAL
  Filled 2020-05-09: qty 1

## 2020-05-09 MED ORDER — INSULIN REGULAR HUMAN 100 UNIT/ML IJ SOLN
INTRAMUSCULAR | 3 refills | Status: AC
Start: 2020-05-09 — End: 2021-05-09

## 2020-05-09 NOTE — Discharge Summary (Addendum)
Physician Discharge Summary  Terry Hinton GEZ:[id number] DOB: 11/08/1955 DOA: 05/07/2020  PCP: Tsosie Billing, MD (Inactive)  Admit date: 05/07/2020 Discharge date: 05/09/2020  Admitted From: Home  Disposition:  Home   Recommendations for Outpatient Follow-up and new medication changes:  1. Follow up with Primary Care in 7 days.  2. Patient has been placed on insulin therapy. 3. Continue antibiotic therapy with Augmentin for 2 more days.  4. Continue capillary glucose monitoring before meals.   Home Health: no   Equipment/Devices: glucometer   Discharge Condition: stable  CODE STATUS: full  Diet recommendation: heart healthy and diabetic prudent,   Brief/Interim Summary: Patient was admitted to the hospital with a working diagnosis of diabetes ketoacidosis.  64 year old male with past medical history for prostate cancer and hypertension.  Reported 2 weeks of generalized malaise, poor appetite, weight loss, recently abdominal discomfort, nausea and vomiting.  Positive polyuria and polydipsia.  Recently diagnosed with diabetes mellitus and placed on oral agents. On his initial physical examination blood pressure 122/96, heart rate 94, respiratory rate 14, oxygen saturation 98%, dry mucous membranes, lungs clear to auscultation bilaterally, heart S1-S2, present rhythm, soft abdomen, no lower extremity edema. Sodium 131, potassium 4.2, chloride 87, bicarb 24, glucose 526, BUN 25 creatinine 1.32, calcium 9.3, anion gap 20, lipase 26, AST 17, ALT 23, white count 11.3, hemoglobin 15.7, hematocrit 45.9, platelets 163.  SARS COVID-19 negative.  Urinalysis with > 500 glucose, specific gravity 1.036, > 50 white cells. CT of the abdomen with small hiatal hernia, hepatic steatosis.  Enlarged prostate. Chest radiograph with no infiltrates. EKG 90 bpm, left axis deviation, normal intervals, QTC 439, sinus rhythm, no ST segment or T wave changes, poor R wave progression  1.  Diabetes  ketoacidosis, type 2 diabetes mellitus/uncontrolled.  Patient initially admitted to the stepdown unit, he was placed on continuous IV infusion insulin with improvement of his hyperglycemia and acidosis.  His anion gap closed, his diet was advanced, and he was successfully transitioned to subcutaneous insulin.  On the day of discharge his fasting glucose is 255, patient will continue basal insulin Levemir, 30 units twice daily along with insulin sliding scale.  Patient received diabetic teaching and will have a glucometer prescribed.  Further adjustments of insulin as an outpatient.  2.  BPH/prostate cancer, complicated by neurogenic infection, present on admission.  Patient had positive urinary symptoms, dysuria, he was placed on antibiotic therapy with ceftriaxone, his cultures were no growth.  Patient will continue 2 more days of Augmentin to complete a 3-day therapy.  Continue with Zytiga.   3.  Hypertension.  His antihypertensive agents were held during his hospitalization, at discharge he will resume hydrochlorothiazide and lisinopril.  4.  Acute kidney injury with hyperkalemia.  Patient received isotonic intravenous fluids with improvement of kidney function, potassium chloride for potassium correction.  At his discharge his K was 3.9 with preserved renal function.     Discharge Diagnoses:  Principal Problem:   DKA (diabetic ketoacidoses) (Terry Hinton) Active Problems:   Hyperglycemia   Prostate cancer (Terry Hinton)   Hypertension   Acute lower UTI   T2DM (type 2 diabetes mellitus) (Terry Hinton)   AKI (acute kidney injury) (Terry Hinton)    Discharge Instructions   Allergies as of 05/09/2020   No Known Allergies     Medication List    TAKE these medications   abiraterone acetate 250 MG tablet Commonly known as: ZYTIGA TAKE 4 TABLETS (1,000 MG TOTAL) BY MOUTH DAILY. TAKE ON AN EMPTY STOMACH 1  HOUR BEFORE OR 2 HOURS AFTER A MEAL   amoxicillin-clavulanate 875-125 MG tablet Commonly known as:  AUGMENTIN Take 1 tablet by mouth every 12 (twelve) hours for 2 days.   blood glucose meter kit and supplies Dispense based on patient and insurance preference. Use up to four times daily as directed. (FOR ICD-10 E10.9, E11.9).   cholecalciferol 1000 units tablet Commonly known as: VITAMIN D Take 1,000 Units by mouth daily.   cyclobenzaprine 5 MG tablet Commonly known as: FLEXERIL Take 5 mg by mouth 3 (three) times daily as needed for muscle spasms.   finasteride 5 MG tablet Commonly known as: PROSCAR Take 5 mg by mouth daily.   insulin detemir 100 UNIT/ML injection Commonly known as: LEVEMIR Inject 0.3 mLs (30 Units total) into the skin 2 (two) times daily.   insulin regular 100 units/mL injection Commonly known as: NOVOLIN R For glucose 121 to 150 use two units, for 151 to 200 use four units, for 201 to 250 use six units, for 251 to 300 use ten units and for 301 to 350 use twelve units, for 351 or greater use fourteen units.   INSULIN SYRINGE .5CC/29G 29G X 1/2" 0.5 ML Misc 0.5 mLs by Does not apply route 5 (five) times daily.   latanoprost 0.005 % ophthalmic solution Commonly known as: XALATAN Place 1 drop into both eyes at bedtime.   lisinopril-hydrochlorothiazide 10-12.5 MG tablet Commonly known as: ZESTORETIC Take 1 tablet by mouth daily.   OVER THE COUNTER MEDICATION Take 1 tablet by mouth daily.   potassium chloride SA 20 MEQ tablet Commonly known as: KLOR-CON Take 1 tablet (20 mEq total) by mouth daily.   predniSONE 5 MG tablet Commonly known as: DELTASONE Take 1 tablet (5 mg total) by mouth daily with breakfast.   tamsulosin 0.4 MG Caps capsule Commonly known as: FLOMAX Take 0.4 mg by mouth daily.   traMADol 50 MG tablet Commonly known as: ULTRAM Take 50 mg by mouth every 6 (six) hours as needed (Pain).       No Known Allergies      Procedures/Studies: DG Chest 2 View  Result Date: 05/07/2020 CLINICAL DATA:  Cough, weakness, dizziness EXAM:  CHEST - 2 VIEW COMPARISON:  None. FINDINGS: The heart size and mediastinal contours are within normal limits. The left hemidiaphragm is obscured which may reflect a trace left pleural effusion or pericardial fat. The right lung is clear. There is no right pleural effusion. There is no pneumothorax. 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 IMPRESSION: Obscuration of the left hemidiaphragm may reflect trace left pleural effusion versus pericardial fat. Electronically Signed   By: Zerita Boers M.D.   On: 05/07/2020 20:47   CT Abdomen Pelvis W Contrast  Result Date: 05/07/2020 CLINICAL DATA:  Weight loss and difficulty eating. History of prostate cancer. EXAM: CT ABDOMEN AND PELVIS WITH CONTRAST TECHNIQUE: Multidetector CT imaging of the abdomen and pelvis was performed using the standard protocol following bolus administration of intravenous contrast. CONTRAST:  179m OMNIPAQUE IOHEXOL 300 MG/ML  SOLN COMPARISON:  December 25, 2017 FINDINGS: Lower chest: No acute abnormality. Hepatobiliary: There is diffuse fatty infiltration of the liver parenchyma. A 9 mm focus of parenchymal low attenuation is seen within the anteromedial aspect of the right lobe of the liver. No gallstones, gallbladder wall thickening, or biliary dilatation. Pancreas: Unremarkable. No pancreatic ductal dilatation or surrounding inflammatory changes. Spleen: Normal in size without focal abnormality. Adrenals/Urinary Tract: Adrenal glands are unremarkable. Kidneys  are normal, without renal calculi, focal lesion, or hydronephrosis. Bladder is unremarkable. Stomach/Bowel: There is a small hiatal hernia. A 2.3 cm x 2.0 cm gastric diverticulum is seen along the posterior aspect of the gastric fundus. Appendix appears normal. No evidence of bowel wall thickening, distention, or inflammatory changes. Vascular/Lymphatic: No significant vascular findings are present. No enlarged abdominal or pelvic lymph nodes.  Reproductive: The prostate gland is mildly enlarged. A mild amount of mass effect is seen along the base of the urinary bladder. Other: No abdominal wall hernia or abnormality. No abdominopelvic ascites. Musculoskeletal: Marked severity degenerative changes are seen at the level of L5-S1. Mild diffuse sclerotic changes are again seen involving the right iliac bone. IMPRESSION: 1. Small hiatal hernia. 2. Hepatic steatosis. 3. Small gastric diverticulum. 4. 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 5. Mildly enlarged prostate gland with mass effect along the base of the urinary bladder. Further evaluation is recommended, as recurring neoplasm cannot be excluded. Electronically Signed   By: Virgina Norfolk M.D.   On: 05/07/2020 22:51        Subjective: Patient is feeling better, no nausea or vomiting, no chest pain or dyspnea.   Discharge Exam: Vitals:   05/09/20 0400 05/09/20 0800  BP: 127/62   Pulse: 69   Resp: 17   Temp: 97.9 F (36.6 C) (!) 97.5 F (36.4 C)  SpO2: 98%    Vitals:   05/08/20 2000 05/09/20 0000 05/09/20 0400 05/09/20 0800  BP:  130/80 127/62   Pulse:  66 69   Resp:  15 17   Temp: 98.7 F (37.1 C) 98.6 F (37 C) 97.9 F (36.6 C) (!) 97.5 F (36.4 C)  TempSrc: Oral Oral Oral Oral  SpO2:  96% 98%   Weight:      Height:        General: Not in pain or dyspnea.  Neurology: Awake and alert, non focal  E ENT: no pallor, no icterus, oral mucosa moist Cardiovascular: No JVD. S1-S2 present, rhythmic, no gallops, rubs, or murmurs. No lower extremity edema. Pulmonary: positive breath sounds bilaterally, adequate air movement, no wheezing, rhonchi or rales. Gastrointestinal. Abdomen soft with no organomegaly, non tender, no rebound or guarding Skin. No rashes Musculoskeletal: no joint deformities   The results of significant diagnostics from this hospitalization (including  imaging, microbiology, ancillary and laboratory) are listed below for reference.     Microbiology: Recent Results (from the past 240 hour(s))  SARS Coronavirus 2 by RT PCR (hospital order, performed in [facility] hospital lab) Nasopharyngeal Nasopharyngeal Swab     Status: None   Collection Time: 05/07/20  8:46 PM   Specimen: Nasopharyngeal Swab  Result Value Ref Range Status   SARS Coronavirus 2 NEGATIVE NEGATIVE Final    Comment: (NOTE) SARS-CoV-2 target nucleic acids are NOT DETECTED.  The SARS-CoV-2 RNA is generally detectable in upper and lower respiratory specimens during the acute phase of infection. The lowest concentration of SARS-CoV-2 viral copies this assay can detect is 250 copies / mL. A negative result does not preclude SARS-CoV-2 infection and should not be used as the sole basis for treatment or other patient management decisions.  A negative result may occur with improper specimen collection / handling, submission of specimen other than nasopharyngeal swab, presence of viral mutation(s) within the areas targeted by this assay, and inadequate number of viral copies (<250 copies / mL). A negative result must be combined with  clinical observations, patient history, and epidemiological information.  Fact Sheet for Patients:   StrictlyIdeas.no  Fact Sheet for Healthcare Providers: BankingDealers.co.za  This test is not yet approved or  cleared by the [place] FDA and has been authorized for detection and/or diagnosis of SARS-CoV-2 by FDA under an Emergency Use Authorization (EUA).  This EUA will remain in effect (meaning this test can be used) for the duration of the COVID-19 declaration under Section 564(b)(1) of the Act, 21 U.S.C. section 360bbb-3(b)(1), unless the authorization is terminated or revoked sooner.  Performed at [facility], [place] [address] [place], [place] [address]   MRSA  PCR Screening     Status: Abnormal   Collection Time: 05/08/20  1:31 AM   Specimen: Nasopharyngeal  Result Value Ref Range Status   MRSA by PCR POSITIVE (A) NEGATIVE Final    Comment:        The GeneXpert MRSA Assay (FDA approved for NASAL specimens only), is one component of a comprehensive MRSA colonization surveillance program. It is not intended to diagnose MRSA infection nor to guide or monitor treatment for MRSA infections. RESULT CALLED TO, READ BACK BY AND VERIFIED WITH: Kittie Plater. RN _0  ON 7.5.2021 BY [facility] Performed at [facility] [address], [place], [place] [address]      Labs: BNP (last 3 results) No results for input(s): BNP in the last 8760 hours. Basic Metabolic Panel: Recent Labs  Lab 05/07/20 1600 05/08/20 0320 05/08/20 0734 05/08/20 1625  NA 131* 133* 136 131*  K 4.2 3.6 3.6 3.9  CL 87* 96* 97* 94*  CO2 _1 GLUCOSE 526* 221* 212* 255*  BUN 25* _2 CREATININE 1.32* 0.88 1.00 0.88  CALCIUM 9.3 8.7* 8.7* 8.6*   Liver Function Tests: Recent Labs  Lab 05/07/20 1600  AST 17  ALT 23  ALKPHOS 215*  BILITOT 1.7*  PROT 7.6  ALBUMIN 4.5   Recent Labs  Lab 05/07/20 1600  LIPASE 26   No results for input(s): AMMONIA in the last 168 hours. CBC: Recent Labs  Lab 05/07/20 1600  WBC 11.3*  HGB 15.7  HCT 45.9  MCV 86.9  PLT 163   Cardiac Enzymes: No results for input(s): CKTOTAL, CKMB, CKMBINDEX, TROPONINI in the last 168 hours. BNP: Invalid input(s): POCBNP CBG: Recent Labs  Lab 05/08/20 1103 05/08/20 1217 05/08/20 1654 05/08/20 2118 05/09/20 0802  GLUCAP 232* 269* 261* 347* 248*   D-Dimer No results for input(s): DDIMER in the last 72 hours. Hgb A1c Recent Labs    05/08/20 0320  HGBA1C 14.4*   Lipid Profile No results for input(s): CHOL, HDL, LDLCALC, TRIG, CHOLHDL, LDLDIRECT in the last 72 hours. Thyroid function studies No results for input(s): TSH, T4TOTAL, T3FREE, THYROIDAB  in the last 72 hours.  Invalid input(s): FREET3 Anemia work up No results for input(s): VITAMINB12, FOLATE, FERRITIN, TIBC, IRON, RETICCTPCT in the last 72 hours. Urinalysis    Component Value Date/Time   COLORURINE YELLOW 05/07/2020 2018   APPEARANCEUR HAZY (A) 05/07/2020 2018   LABSPEC 1.036 (H) 05/07/2020 2018   PHURINE 5.0 05/07/2020 2018   GLUCOSEU >=500 (A) 05/07/2020 2018   HGBUR SMALL (A) 05/07/2020 2018   BILIRUBINUR NEGATIVE 05/07/2020 2018   KETONESUR 80 (A) 05/07/2020 2018   PROTEINUR NEGATIVE 05/07/2020 2018   NITRITE NEGATIVE 05/07/2020 2018   LEUKOCYTESUR SMALL (A) 05/07/2020 2018   Sepsis Labs Invalid input(s): PROCALCITONIN,  WBC,  LACTICIDVEN Microbiology Recent Results (  from the past 240 hour(s))  SARS Coronavirus 2 by RT PCR (hospital order, performed in [facility] hospital lab) Nasopharyngeal Nasopharyngeal Swab     Status: None   Collection Time: 05/07/20  8:46 PM   Specimen: Nasopharyngeal Swab  Result Value Ref Range Status   SARS Coronavirus 2 NEGATIVE NEGATIVE Final    Comment: (NOTE) SARS-CoV-2 target nucleic acids are NOT DETECTED.  The SARS-CoV-2 RNA is generally detectable in upper and lower respiratory specimens during the acute phase of infection. The lowest concentration of SARS-CoV-2 viral copies this assay can detect is 250 copies / mL. A negative result does not preclude SARS-CoV-2 infection and should not be used as the sole basis for treatment or other patient management decisions.  A negative result may occur with improper specimen collection / handling, submission of specimen other than nasopharyngeal swab, presence of viral mutation(s) within the areas targeted by this assay, and inadequate number of viral copies (<250 copies / mL). A negative result must be combined with clinical observations, patient history, and epidemiological information.  Fact Sheet for Patients:   StrictlyIdeas.no  Fact Sheet for  Healthcare Providers: BankingDealers.co.za  This test is not yet approved or  cleared by the [place] FDA and has been authorized for detection and/or diagnosis of SARS-CoV-2 by FDA under an Emergency Use Authorization (EUA).  This EUA will remain in effect (meaning this test can be used) for the duration of the COVID-19 declaration under Section 564(b)(1) of the Act, 21 U.S.C. section 360bbb-3(b)(1), unless the authorization is terminated or revoked sooner.  Performed at [facility], [place] [address], [place], [place] [address]   MRSA PCR Screening     Status: Abnormal   Collection Time: 05/08/20  1:31 AM   Specimen: Nasopharyngeal  Result Value Ref Range Status   MRSA by PCR POSITIVE (A) NEGATIVE Final    Comment:        The GeneXpert MRSA Assay (FDA approved for NASAL specimens only), is one component of a comprehensive MRSA colonization surveillance program. It is not intended to diagnose MRSA infection nor to guide or monitor treatment for MRSA infections. RESULT CALLED TO, READ BACK BY AND VERIFIED WITH: Kittie Plater. RN _0  ON 7.5.2021 BY [facility] Performed at [facility] [address], [place], [place] [address]      Time coordinating discharge: 45 minutes  SIGNED:   Tawni Millers, MD  [facility] 05/09/2020, 9:15 AM

## 2020-05-09 NOTE — Progress Notes (Signed)
Patient has refused bath and CHG twice now tonight. I re-timed for 1000 and patient has agreed to allow his significant other to help him do it once she arrives.

## 2020-05-09 NOTE — Progress Notes (Signed)
Discharge home.

## 2020-05-12 DIAGNOSIS — E119 Type 2 diabetes mellitus without complications: Secondary | ICD-10-CM | POA: Diagnosis not present

## 2020-05-12 DIAGNOSIS — H401211 Low-tension glaucoma, right eye, mild stage: Secondary | ICD-10-CM | POA: Diagnosis not present

## 2020-05-24 ENCOUNTER — Other Ambulatory Visit: Payer: Self-pay | Admitting: Oncology

## 2020-05-24 DIAGNOSIS — C61 Malignant neoplasm of prostate: Secondary | ICD-10-CM

## 2020-05-24 MED FILL — ABIRATERONE ACETATE 250 MG: 250 | 30 days supply | Qty: 120 | Fill #0

## 2020-06-01 DIAGNOSIS — E1136 Type 2 diabetes mellitus with diabetic cataract: Secondary | ICD-10-CM | POA: Diagnosis not present

## 2020-06-01 DIAGNOSIS — Z79899 Other long term (current) drug therapy: Secondary | ICD-10-CM | POA: Diagnosis not present

## 2020-06-21 ENCOUNTER — Other Ambulatory Visit: Payer: Self-pay | Admitting: Oncology

## 2020-06-21 DIAGNOSIS — C61 Malignant neoplasm of prostate: Secondary | ICD-10-CM

## 2020-06-21 MED FILL — ABIRATERONE ACETATE 250 MG: 250 | 30 days supply | Qty: 120 | Fill #0

## 2020-07-13 ENCOUNTER — Other Ambulatory Visit: Payer: Self-pay | Admitting: Oncology

## 2020-07-13 DIAGNOSIS — C61 Malignant neoplasm of prostate: Secondary | ICD-10-CM

## 2020-07-18 MED FILL — ABIRATERONE ACETATE 250 MG: 250 | 30 days supply | Qty: 120 | Fill #0

## 2020-08-10 ENCOUNTER — Other Ambulatory Visit: Payer: Self-pay | Admitting: Oncology

## 2020-08-10 DIAGNOSIS — C61 Malignant neoplasm of prostate: Secondary | ICD-10-CM

## 2020-08-15 MED FILL — ABIRATERONE ACETATE 250 MG: 250 | 30 days supply | Qty: 120 | Fill #0

## 2020-09-06 ENCOUNTER — Other Ambulatory Visit: Payer: Self-pay | Admitting: Oncology

## 2020-09-06 DIAGNOSIS — C61 Malignant neoplasm of prostate: Secondary | ICD-10-CM

## 2020-09-11 MED FILL — ABIRATERONE ACETATE 250 MG: 250 | 30 days supply | Qty: 120 | Fill #0

## 2020-10-04 ENCOUNTER — Other Ambulatory Visit: Payer: Self-pay | Admitting: Oncology

## 2020-10-04 DIAGNOSIS — C61 Malignant neoplasm of prostate: Secondary | ICD-10-CM

## 2020-10-06 MED FILL — ABIRATERONE ACETATE 250 MG: 250 | 30 days supply | Qty: 120 | Fill #0

## 2020-10-31 ENCOUNTER — Other Ambulatory Visit: Payer: Self-pay | Admitting: Oncology

## 2020-10-31 DIAGNOSIS — C61 Malignant neoplasm of prostate: Secondary | ICD-10-CM

## 2020-10-31 MED FILL — ABIRATERONE ACETATE 250 MG: 250 | 30 days supply | Qty: 120 | Fill #0

## 2020-11-27 ENCOUNTER — Other Ambulatory Visit: Payer: Self-pay | Admitting: Oncology

## 2020-11-27 DIAGNOSIS — C61 Malignant neoplasm of prostate: Secondary | ICD-10-CM

## 2020-11-28 ENCOUNTER — Other Ambulatory Visit: Payer: Self-pay | Admitting: Oncology

## 2020-11-28 MED FILL — ABIRATERONE ACETATE 250 MG: 250 | 30 days supply | Qty: 120 | Fill #0

## 2020-12-21 ENCOUNTER — Other Ambulatory Visit: Payer: Self-pay | Admitting: Oncology

## 2020-12-21 DIAGNOSIS — C61 Malignant neoplasm of prostate: Secondary | ICD-10-CM

## 2020-12-22 NOTE — Telephone Encounter (Signed)
Refill request

## 2020-12-25 ENCOUNTER — Other Ambulatory Visit: Payer: Self-pay | Admitting: Oncology

## 2020-12-25 MED FILL — ABIRATERONE ACETATE 250 MG: 250 | 30 days supply | Qty: 120 | Fill #0

## 2021-01-16 ENCOUNTER — Other Ambulatory Visit: Payer: Self-pay | Admitting: Oncology

## 2021-01-16 DIAGNOSIS — C61 Malignant neoplasm of prostate: Secondary | ICD-10-CM

## 2021-02-02 ENCOUNTER — Other Ambulatory Visit (HOSPITAL_COMMUNITY): Payer: Self-pay

## 2021-02-08 ENCOUNTER — Other Ambulatory Visit: Payer: Self-pay | Admitting: Oncology

## 2021-02-08 ENCOUNTER — Other Ambulatory Visit (HOSPITAL_COMMUNITY): Payer: Self-pay

## 2021-02-08 DIAGNOSIS — C61 Malignant neoplasm of prostate: Secondary | ICD-10-CM

## 2021-02-08 MED ORDER — ABIRATERONE ACETATE 250 MG PO TABS
ORAL_TABLET | Freq: Every day | ORAL | 0 refills | Status: DC
  Filled 2021-02-08: qty 120, 30d supply, fill #0

## 2021-02-13 ENCOUNTER — Other Ambulatory Visit (HOSPITAL_COMMUNITY): Payer: Self-pay

## 2021-03-08 ENCOUNTER — Other Ambulatory Visit (HOSPITAL_COMMUNITY): Payer: Self-pay

## 2021-03-08 ENCOUNTER — Other Ambulatory Visit: Payer: Self-pay | Admitting: Oncology

## 2021-03-08 DIAGNOSIS — C61 Malignant neoplasm of prostate: Secondary | ICD-10-CM

## 2021-03-08 MED ORDER — ABIRATERONE ACETATE 250 MG PO TABS
ORAL_TABLET | Freq: Every day | ORAL | 0 refills | Status: DC
  Filled 2021-03-08: qty 120, 30d supply, fill #0

## 2021-03-12 ENCOUNTER — Other Ambulatory Visit (HOSPITAL_COMMUNITY): Payer: Self-pay

## 2021-03-21 ENCOUNTER — Other Ambulatory Visit (HOSPITAL_COMMUNITY): Payer: Self-pay

## 2021-03-21 ENCOUNTER — Telehealth: Payer: Self-pay | Admitting: Oncology

## 2021-03-21 NOTE — Telephone Encounter (Signed)
Scheduled appts per 5/18 sch msg. Pt aware.

## 2021-04-05 ENCOUNTER — Other Ambulatory Visit (HOSPITAL_COMMUNITY): Payer: Self-pay

## 2021-04-06 ENCOUNTER — Other Ambulatory Visit (HOSPITAL_COMMUNITY): Payer: Self-pay

## 2021-04-06 ENCOUNTER — Other Ambulatory Visit: Payer: Self-pay | Admitting: Oncology

## 2021-04-06 DIAGNOSIS — C61 Malignant neoplasm of prostate: Secondary | ICD-10-CM

## 2021-04-06 MED ORDER — ABIRATERONE ACETATE 250 MG PO TABS
ORAL_TABLET | Freq: Every day | ORAL | 0 refills | Status: DC
  Filled 2021-04-06: qty 120, 30d supply, fill #0

## 2021-04-09 ENCOUNTER — Other Ambulatory Visit (HOSPITAL_COMMUNITY): Payer: Self-pay

## 2021-04-30 ENCOUNTER — Other Ambulatory Visit: Payer: Self-pay | Admitting: Oncology

## 2021-04-30 ENCOUNTER — Other Ambulatory Visit (HOSPITAL_COMMUNITY): Payer: Self-pay

## 2021-04-30 DIAGNOSIS — C61 Malignant neoplasm of prostate: Secondary | ICD-10-CM

## 2021-04-30 MED ORDER — ABIRATERONE ACETATE 250 MG PO TABS
ORAL_TABLET | Freq: Every day | ORAL | 0 refills | Status: DC
Start: 2021-04-30 — End: 2021-05-23
  Filled 2021-04-30: qty 120, 30d supply, fill #0

## 2021-05-02 ENCOUNTER — Other Ambulatory Visit (HOSPITAL_COMMUNITY): Payer: Self-pay

## 2021-05-03 ENCOUNTER — Other Ambulatory Visit (HOSPITAL_COMMUNITY): Payer: Self-pay

## 2021-05-04 ENCOUNTER — Other Ambulatory Visit: Payer: Self-pay | Admitting: *Deleted

## 2021-05-04 DIAGNOSIS — C61 Malignant neoplasm of prostate: Secondary | ICD-10-CM

## 2021-05-08 ENCOUNTER — Other Ambulatory Visit: Payer: Self-pay

## 2021-05-08 ENCOUNTER — Inpatient Hospital Stay (HOSPITAL_BASED_OUTPATIENT_CLINIC_OR_DEPARTMENT_OTHER): Payer: Medicare HMO | Admitting: Oncology

## 2021-05-08 ENCOUNTER — Inpatient Hospital Stay: Payer: Medicare HMO | Attending: Oncology

## 2021-05-08 VITALS — BP 121/88 | HR 98 | Temp 97.6°F | Resp 20 | Wt 293.8 lb

## 2021-05-08 DIAGNOSIS — Z5111 Encounter for antineoplastic chemotherapy: Secondary | ICD-10-CM | POA: Insufficient documentation

## 2021-05-08 DIAGNOSIS — C61 Malignant neoplasm of prostate: Secondary | ICD-10-CM | POA: Diagnosis present

## 2021-05-08 DIAGNOSIS — C7951 Secondary malignant neoplasm of bone: Secondary | ICD-10-CM | POA: Insufficient documentation

## 2021-05-08 LAB — CMP (CANCER CENTER ONLY)
ALT: 18 U/L (ref 0–44)
AST: 12 U/L — ABNORMAL LOW (ref 15–41)
Albumin: 4.1 g/dL (ref 3.5–5.0)
Alkaline Phosphatase: 156 U/L — ABNORMAL HIGH (ref 38–126)
Anion gap: 10 (ref 5–15)
BUN: 15 mg/dL (ref 8–23)
CO2: 25 mmol/L (ref 22–32)
Calcium: 10.1 mg/dL (ref 8.9–10.3)
Chloride: 106 mmol/L (ref 98–111)
Creatinine: 1.2 mg/dL (ref 0.61–1.24)
GFR, Estimated: >60 mL/min (ref >60)
Glucose, Bld: 116 mg/dL — ABNORMAL HIGH (ref 70–99)
Potassium: 3.5 mmol/L (ref 3.5–5.1)
Sodium: 141 mmol/L (ref 135–145)
Total Bilirubin: 0.4 mg/dL (ref 0.3–1.2)
Total Protein: 7.4 g/dL (ref 6.5–8.1)

## 2021-05-08 LAB — CBC WITH DIFFERENTIAL (CANCER CENTER ONLY)
Abs Immature Granulocytes: 0.06 10*3/uL (ref 0.00–0.07)
Basophils Absolute: 0.1 10*3/uL (ref 0.0–0.1)
Basophils Relative: 1 %
Eosinophils Absolute: 0.4 10*3/uL (ref 0.0–0.5)
Eosinophils Relative: 5 %
HCT: 42.6 % (ref 39.0–52.0)
Hemoglobin: 14.1 g/dL (ref 13.0–17.0)
Immature Granulocytes: 1 %
Lymphocytes Relative: 34 %
Lymphs Abs: 3.2 10*3/uL (ref 0.7–4.0)
MCH: 29.6 pg (ref 26.0–34.0)
MCHC: 33.1 g/dL (ref 30.0–36.0)
MCV: 89.3 fL (ref 80.0–100.0)
Monocytes Absolute: 0.7 10*3/uL (ref 0.1–1.0)
Monocytes Relative: 7 %
Neutro Abs: 5 10*3/uL (ref 1.7–7.7)
Neutrophils Relative %: 52 %
Platelet Count: 245 10*3/uL (ref 150–400)
RBC: 4.77 MIL/uL (ref 4.22–5.81)
RDW: 12.5 % (ref 11.5–15.5)
WBC Count: 9.4 10*3/uL (ref 4.0–10.5)
nRBC: 0 % (ref 0.0–0.2)

## 2021-05-08 NOTE — Progress Notes (Signed)
Hematology and Oncology Follow Up Visit  Terry Hinton [id number] 01/06/1956 65 y.o. 05/08/2021 11:44 AM Terry Billing, MD (Inactive)No ref. provider found   Principle Diagnosis: 65 year old man with advanced prostate cancer with disease to the bone and lymphadenopathy diagnosed in 2019.  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   Prior Therapy:   Firmagon 240 mg given in 2015.  Patient failed to follow-up after that.  Current therapy:  Androgen deprivation therapy restarted by Terry Hinton in February 2019.  This will be resumed in the near future at the cancer center.  He will receive Eligard 30 mg every 4 months.  Zytiga 1000 mg daily with prednisone 5 mg daily started in March 2019.  Interim History: Terry Hinton is here for return follow-up.  Since the last visit, he has failed to return for a follow-up evaluation in the last 2-1/2 years.  Since that time, he has been taking Zytiga without any major complaints.  He denies any nausea, fatigue or bone pain.  He denies any pathological fractures.         Medications: Updated on review.  Current Outpatient Medications  Medication Sig Dispense Refill   abiraterone acetate (ZYTIGA) 250 MG tablet TAKE 4 TABLETS (1,000 MG TOTAL) BY MOUTH DAILY. TAKE ON AN EMPTY STOMACH 1 HOUR BEFORE OR 2 HOURS AFTER A MEAL 120 tablet 0   blood glucose meter kit and supplies Dispense based on patient and insurance preference. Use up to four times daily as directed. (FOR ICD-10 E10.9, E11.9). 1 each 0   cholecalciferol (VITAMIN D) 1000 units tablet Take 1,000 Units by mouth daily.     cyclobenzaprine (FLEXERIL) 5 MG tablet Take 5 mg by mouth 3 (three) times daily as needed for muscle spasms.     finasteride (PROSCAR) 5 MG tablet Take 5 mg by mouth daily.     insulin detemir (LEVEMIR) 100 UNIT/ML injection Inject 0.3 mLs (30 Units total) into the skin 2 (two) times daily.  10 mL 11   insulin regular (NOVOLIN R) 100 units/mL injection For glucose 121 to 150 use two units, for 151 to 200 use four units, for 201 to 250 use six units, for 251 to 300 use ten units and for 301 to 350 use twelve units, for 351 or greater use fourteen units. 10 mL 3   INSULIN SYRINGE .5CC/29G 29G X 1/2" 0.5 ML MISC 0.5 mLs by Does not apply route 5 (five) times daily. 200 each 5   latanoprost (XALATAN) 0.005 % ophthalmic solution Place 1 drop into both eyes at bedtime.     lisinopril-hydrochlorothiazide (PRINZIDE,ZESTORETIC) 10-12.5 MG tablet Take 1 tablet by mouth daily.     OVER THE COUNTER MEDICATION Take 1 tablet by mouth daily.     potassium chloride SA (K-DUR,KLOR-CON) 20 MEQ tablet Take 1 tablet (20 mEq total) by mouth daily. 30 tablet 0   predniSONE (DELTASONE) 5 MG tablet Take 1 tablet (5 mg total) by mouth daily with breakfast. 90 tablet 3   tamsulosin (FLOMAX) 0.4 MG CAPS capsule Take 0.4 mg by mouth daily.   3   traMADol (ULTRAM) 50 MG tablet Take 50 mg by mouth every 6 (six) hours as needed (Pain).     No current facility-administered medications for this visit.     Allergies: No Known Allergies      Physical Exam:     ECOG: 1    General appearance: Alert,  awake without any distress. Head: Atraumatic without abnormalities Oropharynx: Without any thrush or ulcers. Eyes: No scleral icterus. Lymph nodes: No lymphadenopathy noted in the cervical, supraclavicular, or axillary nodes Heart:regular rate and rhythm, without any murmurs or gallops.   Lung: Clear to auscultation without any rhonchi, wheezes or dullness to percussion. Abdomin: Soft, nontender without any shifting dullness or ascites. Musculoskeletal: No clubbing or cyanosis. Neurological: No motor or sensory deficits.    .     Lab Results: Lab Results  Component Value Date   WBC 9.4 05/08/2021   HGB 14.1 05/08/2021   HCT 42.6 05/08/2021   MCV 89.3 05/08/2021   PLT 245 05/08/2021      Chemistry      Component Value Date/Time   NA 131 (L) 05/08/2020 1625   K 3.9 05/08/2020 1625   CL 94 (L) 05/08/2020 1625   CO2 24 05/08/2020 1625   BUN 20 05/08/2020 1625   CREATININE 0.88 05/08/2020 1625   CREATININE 1.02 12/22/2018 1309      Component Value Date/Time   CALCIUM 8.6 (L) 05/08/2020 1625   ALKPHOS 215 (H) 05/07/2020 1600   AST 17 05/07/2020 1600   AST 14 (L) 12/22/2018 1309   ALT 23 05/07/2020 1600   ALT 15 12/22/2018 1309   BILITOT 1.7 (H) 05/07/2020 1600   BILITOT 0.4 12/22/2018 1309      Results for Terry Hinton (MRN [id number]) as of 05/08/2021 11:58  Ref. Range 08/21/2018 13:29 12/22/2018 13:09  Prostate Specific Ag, Serum Latest Ref Range: 0.0 - 4.0 ng/mL <0.1 <0.1      Assessment and plan:  65 year old man with:   1.  Advanced prostate cancer with disease to the bone and lymphadenopathy diagnosed in 2019.  He has castration-sensitive disease.   The natural course of his disease was updated at this time and treatment choices were discussed.  He has failed to follow-up in the last 2 years but now reestablish care.  He is currently on Zytiga which I recommended continuing for the time being.  We will update his PSA and laboratory testing today.  Complication clinic hypertension, weight gain, edema and others were reviewed.  Alternative treatment options including systemic chemotherapy will be deferred unless he developed castration-resistant disease.     2. Androgen deprivation therapy: Risks and benefits of restarting this treatment were discussed at this time.  He has been receiving it under the care of [facility] but has not received it as of late.  We will start Eligard 30 mg every 4 months.  Complication clinic weight gain, osteoporosis among others were reviewed.   3. Obstructive uropathy: Related to his prostate cancer and resolved at this time..   4. Bone directed therapy: I recommended calcium and vitamin D supplements.  Terry Hinton has been  deferred until he obtains a dental clearance.   5. Prognosis: Therapy remains palliative although aggressive measures are warranted at this time.   6. Follow-up: In 4 months for repeat follow-up.   30  minutes were dedicated to this visit. The time was spent on reviewing laboratory data, discussing treatment options, discussing complication related to therapy and answering questions regarding future plan.      Zola Button, MD 7/5/202211:44 AM

## 2021-05-16 ENCOUNTER — Inpatient Hospital Stay: Payer: Medicare HMO

## 2021-05-16 ENCOUNTER — Other Ambulatory Visit: Payer: Self-pay

## 2021-05-16 VITALS — BP 142/84 | HR 98 | Temp 98.0°F | Resp 20

## 2021-05-16 DIAGNOSIS — Z5111 Encounter for antineoplastic chemotherapy: Secondary | ICD-10-CM | POA: Diagnosis not present

## 2021-05-16 DIAGNOSIS — C61 Malignant neoplasm of prostate: Secondary | ICD-10-CM

## 2021-05-16 MED ORDER — LEUPROLIDE ACETATE (4 MONTH) 30 MG ~~LOC~~ KIT
30.0000 mg | PACK | Freq: Once | SUBCUTANEOUS | Status: AC
  Administered 2021-05-16: 30 mg via SUBCUTANEOUS

## 2021-05-16 MED ORDER — LEUPROLIDE ACETATE (4 MONTH) 30 MG ~~LOC~~ KIT
PACK | SUBCUTANEOUS | Status: AC
  Filled 2021-05-16: qty 30

## 2021-05-16 NOTE — Patient Instructions (Signed)
Leuprolide depot injection What is this medication? LEUPROLIDE (loo PROE lide) is a man-made protein that acts like a natural hormone in the body. It decreases testosterone in men and decreases estrogen in women. In men, this medicine is used to treat advanced prostate cancer. In women, some forms of this medicine may be used to treat endometriosis, uterinefibroids, or other male hormone-related problems. This medicine may be used for other purposes; ask your health care provider orpharmacist if you have questions. COMMON BRAND NAME(S): Eligard, Fensolv, Lupron Depot, Lupron Depot-Ped, Viadur What should I tell my care team before I take this medication? They need to know if you have any of these conditions: diabetes heart disease or previous heart attack high blood pressure high cholesterol mental illness osteoporosis pain or difficulty passing urine seizures spinal cord metastasis stroke suicidal thoughts, plans, or attempt; a previous suicide attempt by you or a family member tobacco smoker unusual vaginal bleeding (women) an unusual or allergic reaction to leuprolide, benzyl alcohol, other medicines, foods, dyes, or preservatives pregnant or trying to get pregnant breast-feeding How should I use this medication? This medicine is for injection into a muscle or for injection under the skin. It is given by a health care professional in a hospital or clinic setting. The specific product will determine how it will be given to you. Make sure youunderstand which product you receive and how often you will receive it. Talk to your pediatrician regarding the use of this medicine in children.Special care may be needed. Overdosage: If you think you have taken too much of this medicine contact apoison control center or emergency room at once. NOTE: This medicine is only for you. Do not share this medicine with others. What if I miss a dose? It is important not to miss a dose. Call your doctor  or health careprofessional if you are unable to keep an appointment. Depot injections: Depot injections are given either once-monthly, every 12 weeks, every 16 weeks, or every 24 weeks depending on the product you are prescribed. The product you are prescribed will be based on if you are male orfemale, and your condition. Make sure you understand your product and dosing. What may interact with this medication? Do not take this medicine with any of the following medications: chasteberry cisapride dronedarone pimozide thioridazine This medicine may also interact with the following medications: herbal or dietary supplements, like black cohosh or DHEA male hormones, like estrogens or progestins and birth control pills, patches, rings, or injections male hormones, like testosterone other medicines that prolong the QT interval (abnormal heart rhythm) This list may not describe all possible interactions. Give your health care provider a list of all the medicines, herbs, non-prescription drugs, or dietary supplements you use. Also tell them if you smoke, drink alcohol, or use illegaldrugs. Some items may interact with your medicine. What should I watch for while using this medication? Visit your doctor or health care professional for regular checks on your progress. During the first weeks of treatment, your symptoms may get worse, but then will improve as you continue your treatment. You may get hot flashes, increased bone pain, increased difficulty passing urine, or an aggravation of nerve symptoms. Discuss these effects with your doctor or health careprofessional, some of them may improve with continued use of this medicine. Male patients may experience a menstrual cycle or spotting during the first months of therapy with this medicine. If this continues, contact your doctor orhealth care professional. This medicine may increase blood sugar. Ask  your healthcare provider if changesin diet or medicines  are needed if you have diabetes. What side effects may I notice from receiving this medication? Side effects that you should report to your doctor or health care professionalas soon as possible: allergic reactions like skin rash, itching or hives, swelling of the face, lips, or tongue breathing problems chest pain depression or memory disorders pain in your legs or groin pain at site where injected or implanted seizures severe headache signs and symptoms of high blood sugar such as being more thirsty or hungry or having to urinate more than normal. You may also feel very tired or have blurry vision swelling of the feet and legs suicidal thoughts or other mood changes visual changes vomiting Side effects that usually do not require medical attention (report to yourdoctor or health care professional if they continue or are bothersome): breast swelling or tenderness decrease in sex drive or performance diarrhea hot flashes loss of appetite muscle, joint, or bone pains nausea redness or irritation at site where injected or implanted skin problems or acne This list may not describe all possible side effects. Call your doctor for medical advice about side effects. You may report side effects to FDA at1-800-FDA-1088. Where should I keep my medication? This drug is given in a hospital or clinic and will not be stored at home. NOTE: This sheet is a summary. It may not cover all possible information. If you have questions about this medicine, talk to your doctor, pharmacist, orhealth care provider.  2022 Elsevier/Gold Standard (2019-09-22 10:35:13)

## 2021-05-23 ENCOUNTER — Other Ambulatory Visit: Payer: Self-pay | Admitting: *Deleted

## 2021-05-23 ENCOUNTER — Other Ambulatory Visit (HOSPITAL_COMMUNITY): Payer: Self-pay

## 2021-05-23 ENCOUNTER — Other Ambulatory Visit: Payer: Self-pay | Admitting: Oncology

## 2021-05-23 DIAGNOSIS — C61 Malignant neoplasm of prostate: Secondary | ICD-10-CM

## 2021-05-23 MED ORDER — ABIRATERONE ACETATE 250 MG PO TABS
ORAL_TABLET | Freq: Every day | ORAL | 0 refills | Status: DC
  Filled 2021-05-23: qty 120, 30d supply, fill #0

## 2021-05-24 ENCOUNTER — Other Ambulatory Visit (HOSPITAL_COMMUNITY): Payer: Self-pay

## 2021-05-24 ENCOUNTER — Encounter: Payer: Self-pay | Admitting: Oncology

## 2021-06-12 ENCOUNTER — Other Ambulatory Visit: Payer: Self-pay | Admitting: Oncology

## 2021-06-12 ENCOUNTER — Other Ambulatory Visit (HOSPITAL_COMMUNITY): Payer: Self-pay

## 2021-06-12 DIAGNOSIS — C61 Malignant neoplasm of prostate: Secondary | ICD-10-CM

## 2021-06-12 MED ORDER — ABIRATERONE ACETATE 250 MG PO TABS
ORAL_TABLET | Freq: Every day | ORAL | 0 refills | Status: DC
  Filled 2021-06-12: qty 120, 30d supply, fill #0

## 2021-06-18 ENCOUNTER — Other Ambulatory Visit (HOSPITAL_COMMUNITY): Payer: Self-pay

## 2021-07-11 ENCOUNTER — Other Ambulatory Visit (HOSPITAL_COMMUNITY): Payer: Self-pay

## 2021-07-11 ENCOUNTER — Other Ambulatory Visit: Payer: Self-pay | Admitting: Oncology

## 2021-07-11 DIAGNOSIS — C61 Malignant neoplasm of prostate: Secondary | ICD-10-CM

## 2021-07-11 MED ORDER — ABIRATERONE ACETATE 250 MG PO TABS
ORAL_TABLET | Freq: Every day | ORAL | 0 refills | Status: DC
  Filled 2021-07-11: qty 120, 30d supply, fill #0

## 2021-07-16 ENCOUNTER — Other Ambulatory Visit (HOSPITAL_COMMUNITY): Payer: Self-pay

## 2021-08-09 ENCOUNTER — Other Ambulatory Visit: Payer: Self-pay | Admitting: Oncology

## 2021-08-09 ENCOUNTER — Other Ambulatory Visit (HOSPITAL_COMMUNITY): Payer: Self-pay

## 2021-08-09 DIAGNOSIS — C61 Malignant neoplasm of prostate: Secondary | ICD-10-CM

## 2021-08-09 MED ORDER — ABIRATERONE ACETATE 250 MG PO TABS
ORAL_TABLET | Freq: Every day | ORAL | 0 refills | Status: DC
Start: 2021-08-09 — End: 2021-09-05
  Filled 2021-08-09: qty 120, 30d supply, fill #0

## 2021-08-13 ENCOUNTER — Other Ambulatory Visit (HOSPITAL_COMMUNITY): Payer: Self-pay

## 2021-09-04 ENCOUNTER — Other Ambulatory Visit: Payer: Medicare HMO

## 2021-09-04 ENCOUNTER — Ambulatory Visit: Payer: Medicare HMO

## 2021-09-04 ENCOUNTER — Ambulatory Visit: Payer: Medicare HMO | Admitting: Oncology

## 2021-09-05 ENCOUNTER — Other Ambulatory Visit: Payer: Self-pay | Admitting: Oncology

## 2021-09-05 ENCOUNTER — Other Ambulatory Visit (HOSPITAL_COMMUNITY): Payer: Self-pay

## 2021-09-05 DIAGNOSIS — C61 Malignant neoplasm of prostate: Secondary | ICD-10-CM

## 2021-09-05 MED ORDER — ABIRATERONE ACETATE 250 MG PO TABS
ORAL_TABLET | Freq: Every day | ORAL | 0 refills | Status: DC
  Filled 2021-09-05: qty 120, 30d supply, fill #0

## 2021-09-06 ENCOUNTER — Inpatient Hospital Stay: Payer: Medicare HMO

## 2021-09-06 ENCOUNTER — Inpatient Hospital Stay: Payer: Medicare HMO | Attending: Oncology

## 2021-09-06 ENCOUNTER — Other Ambulatory Visit: Payer: Self-pay

## 2021-09-06 ENCOUNTER — Inpatient Hospital Stay (HOSPITAL_BASED_OUTPATIENT_CLINIC_OR_DEPARTMENT_OTHER): Payer: Medicare HMO | Admitting: Oncology

## 2021-09-06 VITALS — BP 143/89 | HR 106 | Temp 97.1°F | Resp 20 | Wt 289.7 lb

## 2021-09-06 DIAGNOSIS — N139 Obstructive and reflux uropathy, unspecified: Secondary | ICD-10-CM | POA: Diagnosis not present

## 2021-09-06 DIAGNOSIS — C61 Malignant neoplasm of prostate: Secondary | ICD-10-CM | POA: Diagnosis present

## 2021-09-06 DIAGNOSIS — C7951 Secondary malignant neoplasm of bone: Secondary | ICD-10-CM | POA: Diagnosis not present

## 2021-09-06 DIAGNOSIS — R590 Localized enlarged lymph nodes: Secondary | ICD-10-CM | POA: Diagnosis not present

## 2021-09-06 DIAGNOSIS — Z79899 Other long term (current) drug therapy: Secondary | ICD-10-CM | POA: Diagnosis not present

## 2021-09-06 DIAGNOSIS — Z7952 Long term (current) use of systemic steroids: Secondary | ICD-10-CM | POA: Insufficient documentation

## 2021-09-06 DIAGNOSIS — Z5111 Encounter for antineoplastic chemotherapy: Secondary | ICD-10-CM | POA: Insufficient documentation

## 2021-09-06 LAB — CBC WITH DIFFERENTIAL (CANCER CENTER ONLY)
Abs Immature Granulocytes: 0.05 10*3/uL (ref 0.00–0.07)
Basophils Absolute: 0.1 10*3/uL (ref 0.0–0.1)
Basophils Relative: 1 %
Eosinophils Absolute: 1 10*3/uL — ABNORMAL HIGH (ref 0.0–0.5)
Eosinophils Relative: 11 %
HCT: 42.5 % (ref 39.0–52.0)
Hemoglobin: 14.2 g/dL (ref 13.0–17.0)
Immature Granulocytes: 1 %
Lymphocytes Relative: 34 %
Lymphs Abs: 3.3 10*3/uL (ref 0.7–4.0)
MCH: 29 pg (ref 26.0–34.0)
MCHC: 33.4 g/dL (ref 30.0–36.0)
MCV: 86.9 fL (ref 80.0–100.0)
Monocytes Absolute: 0.6 10*3/uL (ref 0.1–1.0)
Monocytes Relative: 6 %
Neutro Abs: 4.6 10*3/uL (ref 1.7–7.7)
Neutrophils Relative %: 47 %
Platelet Count: 245 10*3/uL (ref 150–400)
RBC: 4.89 MIL/uL (ref 4.22–5.81)
RDW: 12.6 % (ref 11.5–15.5)
WBC Count: 9.7 10*3/uL (ref 4.0–10.5)
nRBC: 0 % (ref 0.0–0.2)

## 2021-09-06 LAB — CMP (CANCER CENTER ONLY)
ALT: 17 U/L (ref 0–44)
AST: 12 U/L — ABNORMAL LOW (ref 15–41)
Albumin: 4.3 g/dL (ref 3.5–5.0)
Alkaline Phosphatase: 184 U/L — ABNORMAL HIGH (ref 38–126)
Anion gap: 10 (ref 5–15)
BUN: 9 mg/dL (ref 8–23)
CO2: 26 mmol/L (ref 22–32)
Calcium: 9.8 mg/dL (ref 8.9–10.3)
Chloride: 105 mmol/L (ref 98–111)
Creatinine: 0.95 mg/dL (ref 0.61–1.24)
GFR, Estimated: >60 mL/min (ref >60)
Glucose, Bld: 165 mg/dL — ABNORMAL HIGH (ref 70–99)
Potassium: 4 mmol/L (ref 3.5–5.1)
Sodium: 141 mmol/L (ref 135–145)
Total Bilirubin: 0.5 mg/dL (ref 0.3–1.2)
Total Protein: 7.2 g/dL (ref 6.5–8.1)

## 2021-09-06 MED ORDER — LEUPROLIDE ACETATE (4 MONTH) 30 MG ~~LOC~~ KIT
30.0000 mg | PACK | Freq: Once | SUBCUTANEOUS | Status: AC
  Administered 2021-09-06: 30 mg via SUBCUTANEOUS
  Filled 2021-09-06: qty 30

## 2021-09-06 NOTE — Progress Notes (Signed)
Hematology and Oncology Follow Up Visit  Terry Hinton [id number] 06-Jan-1956 65 y.o. 09/06/2021 11:57 AM Marvel Plan Hardie Pulley, MD (Inactive)No ref. provider found   Principle Diagnosis: 65 year old man with castration-sensitive advanced prostate cancer with disease to the bone and lymphadenopathy diagnosed in 2019.  He presented at the time of diagnosis with PSA was 2078, Gleason score was 4+5 = 9.   Prior Therapy:   Firmagon 240 mg given in 2015.  Patient failed to follow-up after that.  Current therapy:  Androgen deprivation therapy restarted by Dr. Alyson Ingles in February 2019.  This will be resumed in the near future at the cancer center.  He will receive Eligard 30 mg every 4 months.  Zytiga 1000 mg daily with prednisone 5 mg daily started in March 2019.  Interim History: Mr. Mees returns today for repeat evaluation.  Since the last visit, he reports feeling well without any major complaints.  He continues to tolerate Zytiga without any major concerns.  He denies any nausea, vomiting or abdominal pain.  He denies any hospitalizations or illnesses.  His performance status quality of life remains unchanged.         Medications: Reviewed without changes. Current Outpatient Medications  Medication Sig Dispense Refill   abiraterone acetate (ZYTIGA) 250 MG tablet TAKE 4 TABLETS (1,000 MG TOTAL) BY MOUTH DAILY. TAKE ON AN EMPTY STOMACH 1 HOUR BEFORE OR 2 HOURS AFTER A MEAL 120 tablet 0   blood glucose meter kit and supplies Dispense based on patient and insurance preference. Use up to four times daily as directed. (FOR ICD-10 E10.9, E11.9). 1 each 0   cholecalciferol (VITAMIN D) 1000 units tablet Take 1,000 Units by mouth daily.     cyclobenzaprine (FLEXERIL) 5 MG tablet Take 5 mg by mouth 3 (three) times daily as needed for muscle spasms.     finasteride (PROSCAR) 5 MG tablet Take 5 mg by mouth daily.     insulin detemir (LEVEMIR) 100 UNIT/ML injection Inject 0.3 mLs (30 Units  total) into the skin 2 (two) times daily. 10 mL 11   insulin regular (NOVOLIN R) 100 units/mL injection For glucose 121 to 150 use two units, for 151 to 200 use four units, for 201 to 250 use six units, for 251 to 300 use ten units and for 301 to 350 use twelve units, for 351 or greater use fourteen units. 10 mL 3   INSULIN SYRINGE .5CC/29G 29G X 1/2" 0.5 ML MISC 0.5 mLs by Does not apply route 5 (five) times daily. 200 each 5   latanoprost (XALATAN) 0.005 % ophthalmic solution Place 1 drop into both eyes at bedtime.     lisinopril-hydrochlorothiazide (PRINZIDE,ZESTORETIC) 10-12.5 MG tablet Take 1 tablet by mouth daily.     OVER THE COUNTER MEDICATION Take 1 tablet by mouth daily.     potassium chloride SA (K-DUR,KLOR-CON) 20 MEQ tablet Take 1 tablet (20 mEq total) by mouth daily. 30 tablet 0   predniSONE (DELTASONE) 5 MG tablet Take 1 tablet (5 mg total) by mouth daily with breakfast. 90 tablet 3   tamsulosin (FLOMAX) 0.4 MG CAPS capsule Take 0.4 mg by mouth daily.   3   traMADol (ULTRAM) 50 MG tablet Take 50 mg by mouth every 6 (six) hours as needed (Pain).     No current facility-administered medications for this visit.     Allergies: No Known Allergies      Physical Exam:   Blood pressure (!) 143/89, pulse (!) 106, temperature (!) 97.1 F (36.2  C), resp. rate 20, weight 289 lb 11.2 oz (131.4 kg), SpO2 99 %.   ECOG: 1    General appearance: Comfortable appearing without any discomfort Head: Normocephalic without any trauma Oropharynx: Mucous membranes are moist and pink without any thrush or ulcers. Eyes: Pupils are equal and round reactive to light. Lymph nodes: No cervical, supraclavicular, inguinal or axillary lymphadenopathy.   Heart:regular rate and rhythm.  S1 and S2 without leg edema. Lung: Clear without any rhonchi or wheezes.  No dullness to percussion. Abdomin: Soft, nontender, nondistended with good bowel sounds.  No hepatosplenomegaly. Musculoskeletal: No joint  deformity or effusion.  Full range of motion noted. Neurological: No deficits noted on motor, sensory and deep tendon reflex exam. Skin: No petechial rash or dryness.  Appeared moist.      .     Lab Results: Lab Results  Component Value Date   WBC 9.7 09/06/2021   HGB 14.2 09/06/2021   HCT 42.5 09/06/2021   MCV 86.9 09/06/2021   PLT 245 09/06/2021     Chemistry      Component Value Date/Time   NA 141 05/08/2021 1104   K 3.5 05/08/2021 1104   CL 106 05/08/2021 1104   CO2 25 05/08/2021 1104   BUN 15 05/08/2021 1104   CREATININE 1.20 05/08/2021 1104      Component Value Date/Time   CALCIUM 10.1 05/08/2021 1104   ALKPHOS 156 (H) 05/08/2021 1104   AST 12 (L) 05/08/2021 1104   ALT 18 05/08/2021 1104   BILITOT 0.4 05/08/2021 1104       Results for Terry Hinton (MRN [id number]) as of 09/06/2021 11:59  Ref. Range 08/21/2018 13:29 12/22/2018 13:09  Prostate Specific Ag, Serum Latest Ref Range: 0.0 - 4.0 ng/mL <0.1 <0.1      Assessment and plan:  65 year old man with:   1.  Castration-sensitive advanced prostate cancer with disease to the bone and lymphadenopathy diagnosed in 2019.     He is currently on Zytiga which she has tolerated reasonably well and excellent PSA response.  Risks and benefits of continuing this treatment long-term were reviewed.  Alternative treatment options such as systemic chemotherapy among others will be deferred unless he has advanced disease.  He is agreeable to continue.    2. Androgen deprivation therapy: He is currently on Eligard every 4 months.  Complications including weight gain and hot flashes among others were reviewed.  He will receive that today and repeated in 4 months.   3. Obstructive uropathy: Continues to improve with cancer treatment.   4. Bone directed therapy: He would be a candidate for Xgeva after obtaining dental clearance which I encouraged him to do.  In the meantime I recommended calcium and vitamin D  supplements.   5. Prognosis: His disease is incurable and aggressive measures are warranted however   6. Follow-up: He will return in 4 months for a follow-up visit.   30  minutes were spent on this encounter.  The time was dedicated to reviewing laboratory data, disease status update and outlining future plan of care.      Zola Button, MD 11/3/202211:57 AM

## 2021-09-07 LAB — PROSTATE-SPECIFIC AG, SERUM (LABCORP): Prostate Specific Ag, Serum: <0.1 ng/mL (ref 0.0–4.0)

## 2021-09-10 ENCOUNTER — Other Ambulatory Visit (HOSPITAL_COMMUNITY): Payer: Self-pay

## 2021-09-11 ENCOUNTER — Emergency Department (HOSPITAL_COMMUNITY)
Admission: EM | Admit: 2021-09-11 | Discharge: 2021-09-11 | Disposition: A | Discharge status: Home / Self Care | Payer: Medicare HMO | Attending: Student | Admitting: Student

## 2021-09-11 ENCOUNTER — Encounter (HOSPITAL_COMMUNITY): Payer: Self-pay

## 2021-09-11 DIAGNOSIS — I10 Essential (primary) hypertension: Secondary | ICD-10-CM | POA: Insufficient documentation

## 2021-09-11 DIAGNOSIS — L918 Other hypertrophic disorders of the skin: Secondary | ICD-10-CM | POA: Diagnosis present

## 2021-09-11 DIAGNOSIS — Z794 Long term (current) use of insulin: Secondary | ICD-10-CM | POA: Diagnosis not present

## 2021-09-11 DIAGNOSIS — K1379 Other lesions of oral mucosa: Secondary | ICD-10-CM

## 2021-09-11 DIAGNOSIS — L910 Hypertrophic scar: Secondary | ICD-10-CM

## 2021-09-11 DIAGNOSIS — K116 Mucocele of salivary gland: Secondary | ICD-10-CM | POA: Insufficient documentation

## 2021-09-11 DIAGNOSIS — Z8546 Personal history of malignant neoplasm of prostate: Secondary | ICD-10-CM | POA: Diagnosis not present

## 2021-09-11 DIAGNOSIS — E119 Type 2 diabetes mellitus without complications: Secondary | ICD-10-CM | POA: Insufficient documentation

## 2021-09-11 DIAGNOSIS — Z87891 Personal history of nicotine dependence: Secondary | ICD-10-CM | POA: Insufficient documentation

## 2021-09-11 NOTE — ED Triage Notes (Signed)
Pt arrived via POV, c/o multiple "boils" states on in his mouth right arm and abd

## 2021-09-11 NOTE — Discharge Instructions (Signed)
You were seen in the emergency department for evaluation of multiple different skin problems.  In your mouth, the "boil" that you see is called a mucocele and is not dangerous.  The lesion on your arm is a skin tag and that will need to be removed by dermatologist but is also not dangerous.  On your chest, you have healing scars that look to be a keloid which again is not life-threatening or dangerous.  At this time you are safe for discharge and please follow-up with the dermatology number above to have your skin tag removed.  Return the emergency department if you have worsening of your lesions, fever, vomiting, chest pain or shortness of breath.

## 2021-09-11 NOTE — ED Provider Notes (Signed)
[facility] DEPT Provider Note   CSN: [id number] Arrival date & time: 09/11/21  1127     History Chief Complaint  Patient presents with   Skin Issue    Terry Hinton is a 65 y.o. male who presents the emergency department for evaluation of multiple skin complaints.  Patient states that a dentist sent him to the emergency department because of his mouth lesion.  Patient also arrives with complaints of a lesion under his right arm and over the chest.  Denies chest pain, shortness of breath, denies abdominal pain, nausea, vomiting, fever or other systemic symptoms.  None of these lesions are painful.  HPI     Past Medical History:  Diagnosis Date   Allergy    Anemia    Arthritis    knee   Back pain    back, neck and leg problems, pt. stated   Enlarged prostate    pt. stated   Hypertension    Leg injury    Prostate cancer ([place])    Mets to Bone     Patient Active Problem List   Diagnosis Date Noted   Acute lower UTI 05/08/2020   DKA (diabetic ketoacidoses) 05/08/2020   T2DM (type 2 diabetes mellitus) ([place]) 05/08/2020   AKI (acute kidney injury) ([place]) 05/08/2020   Hypertension    Hyperglycemia 05/07/2020   Prostate cancer ([place])    Mild renal insufficiency     Past Surgical History:  Procedure Laterality Date   NO PAST SURGERIES     PROSTATE BIOPSY         Family History  Problem Relation Age of Onset   Breast cancer Mother    Sickle cell anemia Sister    Colon cancer Neg Hx    Colon polyps Neg Hx    Esophageal cancer Neg Hx    Rectal cancer Neg Hx    Stomach cancer Neg Hx     Social History   Tobacco Use   Smoking status: Former    Types: Cigarettes    Quit date: 05/08/2014    Years since quitting: 7.3   Smokeless tobacco: Never  Substance Use Topics   Alcohol use: Yes    Comment: beer occasionally    Drug use: No    Home Medications Prior to Admission medications   Medication Sig Start Date End Date Taking?  Authorizing Provider  abiraterone acetate (ZYTIGA) 250 MG tablet TAKE 4 TABLETS (1,000 MG TOTAL) BY MOUTH DAILY. TAKE ON AN EMPTY STOMACH 1 HOUR BEFORE OR 2 HOURS AFTER A MEAL 09/05/21 09/05/22  Wyatt Portela, MD  blood glucose meter kit and supplies Dispense based on patient and insurance preference. Use up to four times daily as directed. (FOR ICD-10 E10.9, E11.9). 05/09/20   Arrien, Jimmy Picket, MD  cholecalciferol (VITAMIN D) 1000 units tablet Take 1,000 Units by mouth daily.    [provider]  cyclobenzaprine (FLEXERIL) 5 MG tablet Take 5 mg by mouth 3 (three) times daily as needed for muscle spasms.    [provider]  finasteride (PROSCAR) 5 MG tablet Take 5 mg by mouth daily.    [provider]  insulin detemir (LEVEMIR) 100 UNIT/ML injection Inject 0.3 mLs (30 Units total) into the skin 2 (two) times daily. 05/09/20   Arrien, Jimmy Picket, MD  insulin regular (NOVOLIN R) 100 units/mL injection For glucose 121 to 150 use two units, for 151 to 200 use four units, for 201 to 250 use six units, for 251  to 300 use ten units and for 301 to 350 use twelve units, for 351 or greater use fourteen units. 05/09/20 05/09/21  Arrien, Jimmy Picket, MD  INSULIN SYRINGE .5CC/29G 29G X 1/2" 0.5 ML MISC 0.5 mLs by Does not apply route 5 (five) times daily. 05/09/20   Arrien, Jimmy Picket, MD  latanoprost (XALATAN) 0.005 % ophthalmic solution Place 1 drop into both eyes at bedtime.    [provider]  lisinopril-hydrochlorothiazide (PRINZIDE,ZESTORETIC) 10-12.5 MG tablet Take 1 tablet by mouth daily.    [provider]  OVER THE COUNTER MEDICATION Take 1 tablet by mouth daily.    [provider]  potassium chloride SA (K-DUR,KLOR-CON) 20 MEQ tablet Take 1 tablet (20 mEq total) by mouth daily. 08/21/18   Wyatt Portela, MD  predniSONE (DELTASONE) 5 MG tablet Take 1 tablet (5 mg total) by mouth daily with breakfast. 01/14/18   Wyatt Portela, MD  tamsulosin  (FLOMAX) 0.4 MG CAPS capsule Take 0.4 mg by mouth daily.  02/05/18   [provider]  traMADol (ULTRAM) 50 MG tablet Take 50 mg by mouth every 6 (six) hours as needed (Pain).    [provider]    Allergies    Patient has no known allergies.  Review of Systems   Review of Systems  Constitutional:  Negative for chills, fatigue and fever.  HENT:  Negative for congestion, rhinorrhea and sore throat.   Eyes:  Negative for pain, discharge, redness and itching.  Respiratory:  Negative for cough, shortness of breath and wheezing.   Cardiovascular:  Negative for chest pain.  Gastrointestinal:  Negative for abdominal pain, diarrhea, nausea and vomiting.  Genitourinary:  Negative for dysuria and flank pain.  Musculoskeletal:  Negative for arthralgias and myalgias.  Skin:  Positive for rash.  Neurological:  Negative for dizziness, syncope, weakness, numbness and headaches.   Physical Exam Updated Vital Signs BP 117/83 (BP Location: Left Arm)   Pulse (!) 118   Temp 98.1 F (36.7 C) (Oral)   Resp 17   Ht 6' 8"  (2.032 m)   Wt 102.6 kg   SpO2 99%   BMI 24.86 kg/m   Physical Exam Vitals and nursing note reviewed.  Constitutional:      Appearance: He is well-developed.  HENT:     Head: Normocephalic and atraumatic.     Comments: 0.5 cm mucocele on the inner right mandibular gingiva Eyes:     Conjunctiva/sclera: Conjunctivae normal.  Cardiovascular:     Rate and Rhythm: Normal rate and regular rhythm.     Heart sounds: No murmur heard. Pulmonary:     Effort: Pulmonary effort is normal. No respiratory distress.     Breath sounds: Normal breath sounds.  Abdominal:     Palpations: Abdomen is soft.     Tenderness: There is no abdominal tenderness.  Musculoskeletal:     Cervical back: Neck supple.  Skin:    General: Skin is warm and dry.     Comments: 1 cm acrochordon over the right tricep, 1.5 cm keloid over the anterior chest wall  Neurological:     Mental Status:  He is alert.    ED Results / Procedures / Treatments   Labs (all labs ordered are listed, but only abnormal results are displayed) Labs Reviewed - No data to display  EKG None  Radiology No results found.  Procedures Procedures   Medications Ordered in ED Medications - No data to display  ED Course  I have reviewed  the triage vital signs and the nursing notes.  Pertinent labs & imaging results that were available during my care of the patient were reviewed by me and considered in my medical decision making (see chart for details).    MDM Rules/Calculators/A&P                           Patient seen the emergency department for evaluation of multiple skin complaints.  Physical exam reveals a 0.5 cm mucocele on the inner gingiva of the right mandible, 1 cm acrochordon over the right tricep, 1.5 cm keloid over the anterior chest wall.  A dermatology referral was provided for skin tag removal.  Patient then discharged with outpatient follow-up. Final Clinical Impression(s) / ED Diagnoses Final diagnoses:  Mucocele of mouth  Skin tag  Keloid    Rx / DC Orders ED Discharge Orders     None        Concepcion Kirkpatrick, MD 09/11/21 1322

## 2021-10-04 ENCOUNTER — Other Ambulatory Visit (HOSPITAL_COMMUNITY): Payer: Self-pay

## 2021-10-08 ENCOUNTER — Other Ambulatory Visit: Payer: Self-pay | Admitting: Oncology

## 2021-10-08 ENCOUNTER — Other Ambulatory Visit (HOSPITAL_COMMUNITY): Payer: Self-pay

## 2021-10-08 DIAGNOSIS — C61 Malignant neoplasm of prostate: Secondary | ICD-10-CM

## 2021-10-08 MED ORDER — ABIRATERONE ACETATE 250 MG PO TABS
ORAL_TABLET | Freq: Every day | ORAL | 0 refills | Status: DC
  Filled 2021-10-08: qty 120, 30d supply, fill #0

## 2021-10-30 ENCOUNTER — Other Ambulatory Visit (HOSPITAL_COMMUNITY): Payer: Self-pay

## 2021-10-30 ENCOUNTER — Other Ambulatory Visit: Payer: Self-pay | Admitting: Oncology

## 2021-10-30 DIAGNOSIS — C61 Malignant neoplasm of prostate: Secondary | ICD-10-CM

## 2021-10-30 MED ORDER — ABIRATERONE ACETATE 250 MG PO TABS
ORAL_TABLET | Freq: Every day | ORAL | 0 refills | Status: DC
  Filled 2021-10-30: qty 120, 30d supply, fill #0

## 2021-11-02 ENCOUNTER — Other Ambulatory Visit (HOSPITAL_COMMUNITY): Payer: Self-pay

## 2021-11-27 ENCOUNTER — Other Ambulatory Visit (HOSPITAL_COMMUNITY): Payer: Self-pay

## 2021-11-29 ENCOUNTER — Other Ambulatory Visit: Payer: Self-pay | Admitting: Oncology

## 2021-11-29 ENCOUNTER — Other Ambulatory Visit (HOSPITAL_COMMUNITY): Payer: Self-pay

## 2021-11-29 DIAGNOSIS — C61 Malignant neoplasm of prostate: Secondary | ICD-10-CM

## 2021-11-29 MED ORDER — ABIRATERONE ACETATE 250 MG PO TABS
ORAL_TABLET | Freq: Every day | ORAL | 0 refills | Status: DC
  Filled 2021-11-29: qty 120, 30d supply, fill #0

## 2021-12-21 ENCOUNTER — Other Ambulatory Visit (HOSPITAL_COMMUNITY): Payer: Self-pay

## 2021-12-24 ENCOUNTER — Other Ambulatory Visit (HOSPITAL_COMMUNITY): Payer: Self-pay

## 2021-12-25 ENCOUNTER — Other Ambulatory Visit: Payer: Self-pay | Admitting: Oncology

## 2021-12-25 ENCOUNTER — Other Ambulatory Visit (HOSPITAL_COMMUNITY): Payer: Self-pay

## 2021-12-25 DIAGNOSIS — C61 Malignant neoplasm of prostate: Secondary | ICD-10-CM

## 2021-12-25 MED ORDER — ABIRATERONE ACETATE 250 MG PO TABS
ORAL_TABLET | Freq: Every day | ORAL | 0 refills | Status: DC
  Filled 2021-12-25: qty 120, 30d supply, fill #0

## 2021-12-26 ENCOUNTER — Other Ambulatory Visit (HOSPITAL_COMMUNITY): Payer: Self-pay

## 2021-12-27 ENCOUNTER — Other Ambulatory Visit (HOSPITAL_COMMUNITY): Payer: Self-pay

## 2022-01-04 ENCOUNTER — Other Ambulatory Visit: Payer: Self-pay

## 2022-01-04 ENCOUNTER — Inpatient Hospital Stay (HOSPITAL_BASED_OUTPATIENT_CLINIC_OR_DEPARTMENT_OTHER): Payer: Medicare HMO | Admitting: Oncology

## 2022-01-04 ENCOUNTER — Inpatient Hospital Stay: Payer: Medicare HMO | Attending: Oncology

## 2022-01-04 ENCOUNTER — Inpatient Hospital Stay: Payer: Medicare HMO

## 2022-01-04 VITALS — BP 107/71 | HR 115 | Temp 96.8°F | Resp 19 | Wt 288.2 lb

## 2022-01-04 DIAGNOSIS — Z79899 Other long term (current) drug therapy: Secondary | ICD-10-CM | POA: Diagnosis not present

## 2022-01-04 DIAGNOSIS — R232 Flushing: Secondary | ICD-10-CM | POA: Insufficient documentation

## 2022-01-04 DIAGNOSIS — C779 Secondary and unspecified malignant neoplasm of lymph node, unspecified: Secondary | ICD-10-CM | POA: Insufficient documentation

## 2022-01-04 DIAGNOSIS — C61 Malignant neoplasm of prostate: Secondary | ICD-10-CM

## 2022-01-04 DIAGNOSIS — C7951 Secondary malignant neoplasm of bone: Secondary | ICD-10-CM | POA: Insufficient documentation

## 2022-01-04 DIAGNOSIS — Z5111 Encounter for antineoplastic chemotherapy: Secondary | ICD-10-CM | POA: Insufficient documentation

## 2022-01-04 DIAGNOSIS — R635 Abnormal weight gain: Secondary | ICD-10-CM | POA: Insufficient documentation

## 2022-01-04 LAB — CMP (CANCER CENTER ONLY)
ALT: 20 U/L (ref 0–44)
AST: 13 U/L — ABNORMAL LOW (ref 15–41)
Albumin: 4.6 g/dL (ref 3.5–5.0)
Alkaline Phosphatase: 152 U/L — ABNORMAL HIGH (ref 38–126)
Anion gap: 10 (ref 5–15)
BUN: 19 mg/dL (ref 8–23)
CO2: 25 mmol/L (ref 22–32)
Calcium: 10.2 mg/dL (ref 8.9–10.3)
Chloride: 105 mmol/L (ref 98–111)
Creatinine: 1.13 mg/dL (ref 0.61–1.24)
GFR, Estimated: >60 mL/min (ref >60)
Glucose, Bld: 171 mg/dL — ABNORMAL HIGH (ref 70–99)
Potassium: 3.7 mmol/L (ref 3.5–5.1)
Sodium: 140 mmol/L (ref 135–145)
Total Bilirubin: 0.4 mg/dL (ref 0.3–1.2)
Total Protein: 7.3 g/dL (ref 6.5–8.1)

## 2022-01-04 LAB — CBC WITH DIFFERENTIAL (CANCER CENTER ONLY)
Abs Immature Granulocytes: 0.08 10*3/uL — ABNORMAL HIGH (ref 0.00–0.07)
Basophils Absolute: 0.1 10*3/uL (ref 0.0–0.1)
Basophils Relative: 1 %
Eosinophils Absolute: 0.6 10*3/uL — ABNORMAL HIGH (ref 0.0–0.5)
Eosinophils Relative: 5 %
HCT: 43.3 % (ref 39.0–52.0)
Hemoglobin: 14.3 g/dL (ref 13.0–17.0)
Immature Granulocytes: 1 %
Lymphocytes Relative: 36 %
Lymphs Abs: 3.8 10*3/uL (ref 0.7–4.0)
MCH: 28.8 pg (ref 26.0–34.0)
MCHC: 33 g/dL (ref 30.0–36.0)
MCV: 87.3 fL (ref 80.0–100.0)
Monocytes Absolute: 0.8 10*3/uL (ref 0.1–1.0)
Monocytes Relative: 8 %
Neutro Abs: 5.3 10*3/uL (ref 1.7–7.7)
Neutrophils Relative %: 49 %
Platelet Count: 261 10*3/uL (ref 150–400)
RBC: 4.96 MIL/uL (ref 4.22–5.81)
RDW: 12.7 % (ref 11.5–15.5)
WBC Count: 10.6 10*3/uL — ABNORMAL HIGH (ref 4.0–10.5)
nRBC: 0 % (ref 0.0–0.2)

## 2022-01-04 MED ORDER — LEUPROLIDE ACETATE (4 MONTH) 30 MG ~~LOC~~ KIT
30.0000 mg | PACK | Freq: Once | SUBCUTANEOUS | Status: AC
  Administered 2022-01-04: 30 mg via SUBCUTANEOUS
  Filled 2022-01-04: qty 30

## 2022-01-04 NOTE — Progress Notes (Signed)
Hematology and Oncology Follow Up Visit ? ?Terry Hinton ?[id number] ?[birth year] 66 y.o. ?01/04/2022 12:05 PM ?Terry Billing, MD (Inactive)No ref. provider found  ? 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 ? ? ?Prior Therapy:  ? ?Firmagon 240 mg given in 2015.  Patient failed to follow-up after that. ? ?Current therapy: ? ?Androgen deprivation therapy started Dr. Alyson Ingles in February 2019.  He is currently receiving Eligard 30 mg every 4 months. ? ?Zytiga 1000 mg daily with prednisone 5 mg daily started in March 2019. ? ?Interim History: Terry Hinton is here for a follow-up visit.  Since last visit, he reports feeling well without any major complaints.  He denies any recent hospitalizations or illnesses.  He denies any bone pain or pathological fractures.  He denies any complications related to Zytiga. ?  ? ? ? ? ? ? ?Medications: Updated on review. ?Current Outpatient Medications  ?Medication Sig Dispense Refill  ? abiraterone acetate (ZYTIGA) 250 MG tablet TAKE 4 TABLETS (1,000 MG TOTAL) BY MOUTH DAILY. TAKE ON AN EMPTY STOMACH 1 HOUR BEFORE OR 2 HOURS AFTER A MEAL 120 tablet 0  ? blood glucose meter kit and supplies Dispense based on patient and insurance preference. Use up to four times daily as directed. (FOR ICD-10 E10.9, E11.9). 1 each 0  ? cholecalciferol (VITAMIN D) 1000 units tablet Take 1,000 Units by mouth daily.    ? cyclobenzaprine (FLEXERIL) 5 MG tablet Take 5 mg by mouth 3 (three) times daily as needed for muscle spasms.    ? finasteride (PROSCAR) 5 MG tablet Take 5 mg by mouth daily.    ? insulin detemir (LEVEMIR) 100 UNIT/ML injection Inject 0.3 mLs (30 Units total) into the skin 2 (two) times daily. 10 mL 11  ? insulin regular (NOVOLIN R) 100 units/mL injection For glucose 121 to 150  use two units, for 151 to 200 use four units, for 201 to 250 use six units, for 251 to 300 use ten units and for 301 to 350 use twelve units, for 351 or greater use fourteen units. 10 mL 3  ? INSULIN SYRINGE .5CC/29G 29G X 1/2" 0.5 ML MISC 0.5 mLs by Does not apply route 5 (five) times daily. 200 each 5  ? latanoprost (XALATAN) 0.005 % ophthalmic solution Place 1 drop into both eyes at bedtime.    ? lisinopril-hydrochlorothiazide (PRINZIDE,ZESTORETIC) 10-12.5 MG tablet Take 1 tablet by mouth daily.    ? OVER THE COUNTER MEDICATION Take 1 tablet by mouth daily.    ? potassium chloride SA (K-DUR,KLOR-CON) 20 MEQ tablet Take 1 tablet (20 mEq total) by mouth daily. 30 tablet 0  ? predniSONE (DELTASONE) 5 MG tablet Take 1 tablet (5 mg total) by mouth daily with breakfast. 90 tablet 3  ? tamsulosin (FLOMAX) 0.4 MG CAPS capsule Take 0.4 mg by mouth daily.   3  ? traMADol (ULTRAM) 50 MG tablet Take 50 mg by mouth every 6 (six) hours as needed (Pain).    ? ?No current facility-administered medications for this visit.  ? ? ? ?Allergies: No Known Allergies ? ? ? ? ? ?Physical Exam: ?  ?Blood pressure 107/71, pulse (!) 115, temperature (!) 96.8 ?F (36 ?C), temperature source Tympanic, resp. rate 19, weight 288 lb 4 oz (130.7 kg), SpO2 94 %. ? ? ?ECOG: 1 ? ? ?  General appearance: Alert, awake without any distress. ?Head: Atraumatic without abnormalities ?Oropharynx: Without any thrush or ulcers. ?Eyes: No scleral icterus. ?Lymph nodes: No lymphadenopathy noted in the cervical, supraclavicular, or axillary nodes ?Heart:regular rate and rhythm, without any murmurs or gallops.   ?Lung: Clear to auscultation without any rhonchi, wheezes or dullness to percussion. ?Abdomin: Soft, nontender without any shifting dullness or ascites. ?Musculoskeletal: No clubbing or cyanosis. ?Neurological: No motor or sensory deficits. ?Skin: No rashes or lesions. ? ? ? ? ? ?. ? ? ? ? ?Lab Results: ?Lab Results  ?Component Value Date  ? WBC 10.6 (H)  01/04/2022  ? HGB 14.3 01/04/2022  ? HCT 43.3 01/04/2022  ? MCV 87.3 01/04/2022  ? PLT 261 01/04/2022  ? ?  Chemistry   ?   ?Component Value Date/Time  ? NA 141 09/06/2021 1140  ? K 4.0 09/06/2021 1140  ? CL 105 09/06/2021 1140  ? CO2 26 09/06/2021 1140  ? BUN 9 09/06/2021 1140  ? CREATININE 0.95 09/06/2021 1140  ?    ?Component Value Date/Time  ? CALCIUM 9.8 09/06/2021 1140  ? ALKPHOS 184 (H) 09/06/2021 1140  ? AST 12 (L) 09/06/2021 1140  ? ALT 17 09/06/2021 1140  ? BILITOT 0.5 09/06/2021 1140  ?  ? ? ? Latest Reference Range & Units 04/21/18 14:08 08/21/18 13:29 12/22/18 13:09 09/06/21 11:40  ?Prostate Specific Ag, Serum 0.0 - 4.0 ng/mL 0.2 <0.1 <0.1 <0.1  ? ? ? ? ? ? ?Assessment and plan: ? ?66 year old man with: ?  ?1.  Advanced prostate cancer with disease to the bone and lymphadenopathy diagnosed in 2019.  He has castration-sensitive disease. ? ? ?He continues to have excellent response to [facility] with PSA remains undetectable.  Risks and benefits of continuing this treatment were reviewed at this time.  Complications including hypertension, adrenal sufficiency and edema were discussed.  Alternative salvage therapy options including systemic chemotherapy will be deferred at this time.  He understands these options may be needed when he develops castration-resistant disease ? ?  ?2. Androgen deprivation therapy: He will receive Eligard today and repeated every 4 months.  Complication: Weight gain hot flashes and sexual dysfunction were reiterated. ?  ?  ?3. Bone directed therapy: I continue to recommend calcium and vitamin D supplements.  Nexium has been deferred till obtaining dental clearance. ?  ?4. Prognosis: Therapy remains palliative although aggressive measures are warranted given his reasonable performance status. ?  ?5. Follow-up: He will return in 4 months for a follow-up visit. ?  ?30  minutes were dedicated to this visit.  The time was spent on reviewing laboratory data, disease status update and  outlining future plan of care reviewed. ? ? ? ? ? ?Zola Button, MD ?3/3/202312:05 PM ?

## 2022-01-05 LAB — PROSTATE-SPECIFIC AG, SERUM (LABCORP): Prostate Specific Ag, Serum: <0.1 ng/mL (ref 0.0–4.0)

## 2022-01-17 ENCOUNTER — Other Ambulatory Visit (HOSPITAL_COMMUNITY): Payer: Self-pay

## 2022-01-21 ENCOUNTER — Other Ambulatory Visit (HOSPITAL_COMMUNITY): Payer: Self-pay

## 2022-01-23 ENCOUNTER — Other Ambulatory Visit (HOSPITAL_COMMUNITY): Payer: Self-pay

## 2022-01-23 ENCOUNTER — Other Ambulatory Visit: Payer: Self-pay | Admitting: Oncology

## 2022-01-23 DIAGNOSIS — C61 Malignant neoplasm of prostate: Secondary | ICD-10-CM

## 2022-01-23 MED ORDER — ABIRATERONE ACETATE 250 MG PO TABS
ORAL_TABLET | Freq: Every day | ORAL | 0 refills | Status: DC
  Filled 2022-01-23: qty 120, 30d supply, fill #0

## 2022-02-12 ENCOUNTER — Other Ambulatory Visit (HOSPITAL_COMMUNITY): Payer: Self-pay

## 2022-02-15 ENCOUNTER — Other Ambulatory Visit: Payer: Self-pay | Admitting: Oncology

## 2022-02-15 ENCOUNTER — Other Ambulatory Visit (HOSPITAL_COMMUNITY): Payer: Self-pay

## 2022-02-15 DIAGNOSIS — C61 Malignant neoplasm of prostate: Secondary | ICD-10-CM

## 2022-02-15 MED ORDER — ABIRATERONE ACETATE 250 MG PO TABS
ORAL_TABLET | Freq: Every day | ORAL | 0 refills | Status: DC
  Filled 2022-02-15: qty 120, 30d supply, fill #0

## 2022-03-07 ENCOUNTER — Other Ambulatory Visit (HOSPITAL_COMMUNITY): Payer: Self-pay

## 2022-03-11 ENCOUNTER — Other Ambulatory Visit: Payer: Self-pay | Admitting: Oncology

## 2022-03-11 ENCOUNTER — Other Ambulatory Visit (HOSPITAL_COMMUNITY): Payer: Self-pay

## 2022-03-11 DIAGNOSIS — C61 Malignant neoplasm of prostate: Secondary | ICD-10-CM

## 2022-03-11 MED ORDER — ABIRATERONE ACETATE 250 MG PO TABS
ORAL_TABLET | Freq: Every day | ORAL | 0 refills | Status: DC
  Filled 2022-03-11: qty 120, 30d supply, fill #0

## 2022-04-02 ENCOUNTER — Other Ambulatory Visit: Payer: Self-pay | Admitting: Oncology

## 2022-04-02 ENCOUNTER — Other Ambulatory Visit (HOSPITAL_COMMUNITY): Payer: Self-pay

## 2022-04-02 DIAGNOSIS — C61 Malignant neoplasm of prostate: Secondary | ICD-10-CM

## 2022-04-02 MED ORDER — ABIRATERONE ACETATE 250 MG PO TABS
ORAL_TABLET | Freq: Every day | ORAL | 0 refills | Status: DC
  Filled 2022-04-02: qty 120, 30d supply, fill #0

## 2022-04-03 ENCOUNTER — Other Ambulatory Visit (HOSPITAL_COMMUNITY): Payer: Self-pay

## 2022-04-11 ENCOUNTER — Telehealth: Payer: Self-pay | Admitting: Oncology

## 2022-04-11 NOTE — Telephone Encounter (Signed)
Called patient regarding upcoming July appointment, patient has been called and notified. 

## 2022-04-26 ENCOUNTER — Other Ambulatory Visit (HOSPITAL_COMMUNITY): Payer: Self-pay

## 2022-04-29 ENCOUNTER — Other Ambulatory Visit (HOSPITAL_COMMUNITY): Payer: Self-pay

## 2022-04-29 ENCOUNTER — Other Ambulatory Visit: Payer: Self-pay | Admitting: Oncology

## 2022-04-29 DIAGNOSIS — C61 Malignant neoplasm of prostate: Secondary | ICD-10-CM

## 2022-04-29 MED ORDER — ABIRATERONE ACETATE 250 MG PO TABS
ORAL_TABLET | Freq: Every day | ORAL | 0 refills | Status: DC
  Filled 2022-04-29: qty 120, 30d supply, fill #0

## 2022-04-30 ENCOUNTER — Other Ambulatory Visit (HOSPITAL_COMMUNITY): Payer: Self-pay

## 2022-05-09 ENCOUNTER — Inpatient Hospital Stay: Payer: Medicare HMO

## 2022-05-09 ENCOUNTER — Inpatient Hospital Stay (HOSPITAL_BASED_OUTPATIENT_CLINIC_OR_DEPARTMENT_OTHER): Payer: Medicare HMO | Admitting: Oncology

## 2022-05-09 ENCOUNTER — Inpatient Hospital Stay: Payer: Medicare HMO | Attending: Oncology

## 2022-05-09 ENCOUNTER — Other Ambulatory Visit: Payer: Self-pay

## 2022-05-09 VITALS — BP 94/65 | HR 105 | Temp 98.2°F | Resp 19 | Ht >= 80 in | Wt 285.0 lb

## 2022-05-09 DIAGNOSIS — I10 Essential (primary) hypertension: Secondary | ICD-10-CM | POA: Insufficient documentation

## 2022-05-09 DIAGNOSIS — C61 Malignant neoplasm of prostate: Secondary | ICD-10-CM

## 2022-05-09 DIAGNOSIS — Z79899 Other long term (current) drug therapy: Secondary | ICD-10-CM | POA: Diagnosis not present

## 2022-05-09 DIAGNOSIS — R110 Nausea: Secondary | ICD-10-CM | POA: Diagnosis not present

## 2022-05-09 DIAGNOSIS — Z5111 Encounter for antineoplastic chemotherapy: Secondary | ICD-10-CM | POA: Insufficient documentation

## 2022-05-09 DIAGNOSIS — C7951 Secondary malignant neoplasm of bone: Secondary | ICD-10-CM | POA: Diagnosis not present

## 2022-05-09 DIAGNOSIS — C779 Secondary and unspecified malignant neoplasm of lymph node, unspecified: Secondary | ICD-10-CM | POA: Diagnosis not present

## 2022-05-09 DIAGNOSIS — R5383 Other fatigue: Secondary | ICD-10-CM | POA: Diagnosis not present

## 2022-05-09 DIAGNOSIS — R609 Edema, unspecified: Secondary | ICD-10-CM | POA: Diagnosis not present

## 2022-05-09 LAB — CBC WITH DIFFERENTIAL (CANCER CENTER ONLY)
Abs Immature Granulocytes: 0.03 10*3/uL (ref 0.00–0.07)
Basophils Absolute: 0.1 10*3/uL (ref 0.0–0.1)
Basophils Relative: 1 %
Eosinophils Absolute: 0.4 10*3/uL (ref 0.0–0.5)
Eosinophils Relative: 5 %
HCT: 43.3 % (ref 39.0–52.0)
Hemoglobin: 14.4 g/dL (ref 13.0–17.0)
Immature Granulocytes: 0 %
Lymphocytes Relative: 39 %
Lymphs Abs: 3 10*3/uL (ref 0.7–4.0)
MCH: 29 pg (ref 26.0–34.0)
MCHC: 33.3 g/dL (ref 30.0–36.0)
MCV: 87.1 fL (ref 80.0–100.0)
Monocytes Absolute: 0.6 10*3/uL (ref 0.1–1.0)
Monocytes Relative: 8 %
Neutro Abs: 3.7 10*3/uL (ref 1.7–7.7)
Neutrophils Relative %: 47 %
Platelet Count: 257 10*3/uL (ref 150–400)
RBC: 4.97 MIL/uL (ref 4.22–5.81)
RDW: 13.2 % (ref 11.5–15.5)
WBC Count: 7.8 10*3/uL (ref 4.0–10.5)
nRBC: 0 % (ref 0.0–0.2)

## 2022-05-09 LAB — CMP (CANCER CENTER ONLY)
ALT: 14 U/L (ref 0–44)
AST: 10 U/L — ABNORMAL LOW (ref 15–41)
Albumin: 4.6 g/dL (ref 3.5–5.0)
Alkaline Phosphatase: 139 U/L — ABNORMAL HIGH (ref 38–126)
Anion gap: 10 (ref 5–15)
BUN: 15 mg/dL (ref 8–23)
CO2: 26 mmol/L (ref 22–32)
Calcium: 9.7 mg/dL (ref 8.9–10.3)
Chloride: 103 mmol/L (ref 98–111)
Creatinine: 1.34 mg/dL — ABNORMAL HIGH (ref 0.61–1.24)
GFR, Estimated: 58 mL/min — ABNORMAL LOW (ref >60)
Glucose, Bld: 133 mg/dL — ABNORMAL HIGH (ref 70–99)
Potassium: 3.5 mmol/L (ref 3.5–5.1)
Sodium: 139 mmol/L (ref 135–145)
Total Bilirubin: 0.7 mg/dL (ref 0.3–1.2)
Total Protein: 7.3 g/dL (ref 6.5–8.1)

## 2022-05-09 MED ORDER — LEUPROLIDE ACETATE (4 MONTH) 30 MG ~~LOC~~ KIT
30.0000 mg | PACK | Freq: Once | SUBCUTANEOUS | Status: AC
  Administered 2022-05-09: 30 mg via SUBCUTANEOUS
  Filled 2022-05-09: qty 30

## 2022-05-09 NOTE — Progress Notes (Signed)
Hematology and Oncology Follow Up Visit  Terry Hinton [id number] 1955/11/10 66 y.o. 05/09/2022 12:34 PM Tsosie Billing, MD (Inactive)No ref. provider found   Principle Diagnosis: 66 year old man with castration-sensitive advanced prostate cancer with disease to the bone and lymphadenopathy diagnosed in 2019.  He presented with PSA was 2078, Gleason score was 4+5 = 9 at the time of diagnosis.   Prior Therapy:   Firmagon 240 mg given in 2015.  Patient failed to follow-up after that.  Current therapy:   He is currently on Eligard 30 mg every 4 months.  He will receive next injection today.  Zytiga 1000 mg daily with prednisone 5 mg daily started in March 2019.  Interim History: Terry Hinton returns today for a follow-up evaluation.  Since the last visit, he reports feeling well without any major complaints.  He denies any nausea, vomiting or abdominal pain.  He denies any hospitalizations or illnesses.  He denies any complications related to Zytiga at this time.  He denies any bone pain or pathological fractures.  His performance status quality of life remain excellent.         Medications: Reviewed without changes. Current Outpatient Medications  Medication Sig Dispense Refill   abiraterone acetate (ZYTIGA) 250 MG tablet TAKE 4 TABLETS (1,000 MG TOTAL) BY MOUTH DAILY. TAKE ON AN EMPTY STOMACH 1 HOUR BEFORE OR 2 HOURS AFTER A MEAL 120 tablet 0   blood glucose meter kit and supplies Dispense based on patient and insurance preference. Use up to four times daily as directed. (FOR ICD-10 E10.9, E11.9). 1 each 0   cholecalciferol (VITAMIN D) 1000 units tablet Take 1,000 Units by mouth daily.     cyclobenzaprine (FLEXERIL) 5 MG tablet Take 5 mg by mouth 3 (three) times daily as needed for muscle spasms.     finasteride (PROSCAR) 5 MG tablet Take 5 mg by mouth daily.     insulin detemir (LEVEMIR) 100 UNIT/ML injection Inject 0.3 mLs (30 Units total) into the skin 2 (two) times  daily. 10 mL 11   insulin regular (NOVOLIN R) 100 units/mL injection For glucose 121 to 150 use two units, for 151 to 200 use four units, for 201 to 250 use six units, for 251 to 300 use ten units and for 301 to 350 use twelve units, for 351 or greater use fourteen units. 10 mL 3   INSULIN SYRINGE .5CC/29G 29G X 1/2" 0.5 ML MISC 0.5 mLs by Does not apply route 5 (five) times daily. 200 each 5   latanoprost (XALATAN) 0.005 % ophthalmic solution Place 1 drop into both eyes at bedtime.     lisinopril-hydrochlorothiazide (PRINZIDE,ZESTORETIC) 10-12.5 MG tablet Take 1 tablet by mouth daily.     OVER THE COUNTER MEDICATION Take 1 tablet by mouth daily.     potassium chloride SA (K-DUR,KLOR-CON) 20 MEQ tablet Take 1 tablet (20 mEq total) by mouth daily. 30 tablet 0   predniSONE (DELTASONE) 5 MG tablet Take 1 tablet (5 mg total) by mouth daily with breakfast. 90 tablet 3   tamsulosin (FLOMAX) 0.4 MG CAPS capsule Take 0.4 mg by mouth daily.   3   traMADol (ULTRAM) 50 MG tablet Take 50 mg by mouth every 6 (six) hours as needed (Pain).     No current facility-administered medications for this visit.     Allergies: No Known Allergies      Physical Exam:   Blood pressure 94/65, pulse (!) 105, temperature 98.2 F (36.8 C), temperature source Temporal, resp. rate  19, height _0  (2.032 m), weight 285 lb (129.3 kg), SpO2 98 %.    ECOG: 1    General appearance: Comfortable appearing without any discomfort Head: Normocephalic without any trauma Oropharynx: Mucous membranes are moist and pink without any thrush or ulcers. Eyes: Pupils are equal and round reactive to light. Lymph nodes: No cervical, supraclavicular, inguinal or axillary lymphadenopathy.   Heart:regular rate and rhythm.  S1 and S2 without leg edema. Lung: Clear without any rhonchi or wheezes.  No dullness to percussion. Abdomin: Soft, nontender, nondistended with good bowel sounds.  No hepatosplenomegaly. Musculoskeletal: No  joint deformity or effusion.  Full range of motion noted. Neurological: No deficits noted on motor, sensory and deep tendon reflex exam. Skin: No petechial rash or dryness.  Appeared moist.        .     Lab Results: Lab Results  Component Value Date   WBC 10.6 (H) 01/04/2022   HGB 14.3 01/04/2022   HCT 43.3 01/04/2022   MCV 87.3 01/04/2022   PLT 261 01/04/2022     Chemistry      Component Value Date/Time   NA 140 01/04/2022 1136   K 3.7 01/04/2022 1136   CL 105 01/04/2022 1136   CO2 25 01/04/2022 1136   BUN 19 01/04/2022 1136   CREATININE 1.13 01/04/2022 1136      Component Value Date/Time   CALCIUM 10.2 01/04/2022 1136   ALKPHOS 152 (H) 01/04/2022 1136   AST 13 (L) 01/04/2022 1136   ALT 20 01/04/2022 1136   BILITOT 0.4 01/04/2022 1136         Latest Reference Range & Units 09/06/21 11:40 01/04/22 11:36  Prostate Specific Ag, Serum 0.0 - 4.0 ng/mL <0.1 <0.1     Assessment and plan:  66 year old man with:   1.  Castration-sensitive advanced prostate cancer with disease to the bone and lymphadenopathy diagnosed in 2019.    His disease status was updated at this time and treatment choices were reviewed.  His PSA continues to be undetectable with reasonable tolerance to Zytiga.  Risks and benefits of continuing this treatment were reviewed.  Complications that include nausea, fatigue, hypertension and edema were reiterated.  Alternative options such as Taxotere chemotherapy as well as PARP inhibitor if he harbors appropriate mutation could be considered if he developed castration-resistant disease.  He is agreeable to continue.    2. Androgen deprivation therapy: He will receive Eligard today and repeated in 4 months.  Complications include weight gain, hot flashes among others were discussed.     3. Bone directed therapy: He is currently on calcium and vitamin D supplements.  [name] has been deferred as he completes dental clearance.   4. Prognosis: His  disease is incurable although aggressive measures are warranted given his excellent performance status.  5.  Genetic considerations: I offered him genetic counseling given his advanced prostate cancer and the possibility of having germline mutation in addition to a possible somatic mutation were discussed.  He is agreeable for this referral.   6. Follow-up: In 4 months for repeat follow-up.   30  minutes were spent on this encounter.  The time was dedicated to reviewing laboratory data, disease status update and outlining future plan of care review.      Zola Button, MD 7/6/202312:34 PM

## 2022-05-09 NOTE — Addendum Note (Signed)
Addended by: Wyatt Portela on: 05/09/2022 01:11 PM   Modules accepted: Orders

## 2022-05-10 LAB — PROSTATE-SPECIFIC AG, SERUM (LABCORP): Prostate Specific Ag, Serum: <0.1 ng/mL (ref 0.0–4.0)

## 2022-05-13 ENCOUNTER — Telehealth: Payer: Self-pay | Admitting: Oncology

## 2022-05-13 NOTE — Telephone Encounter (Signed)
.  Called patient to schedule appointment per 7/6 inbasket, patient is aware of date and time.   

## 2022-05-13 NOTE — Telephone Encounter (Signed)
.  Called patient to schedule appointment per 7/10 inbasket, patient is aware of date and time.   

## 2022-05-22 ENCOUNTER — Other Ambulatory Visit (HOSPITAL_COMMUNITY): Payer: Self-pay

## 2022-05-24 ENCOUNTER — Other Ambulatory Visit: Payer: Self-pay | Admitting: Oncology

## 2022-05-24 ENCOUNTER — Other Ambulatory Visit (HOSPITAL_COMMUNITY): Payer: Self-pay

## 2022-05-24 DIAGNOSIS — C61 Malignant neoplasm of prostate: Secondary | ICD-10-CM

## 2022-05-24 MED ORDER — ABIRATERONE ACETATE 250 MG PO TABS
ORAL_TABLET | Freq: Every day | ORAL | 0 refills | Status: DC
  Filled 2022-05-24: qty 120, 30d supply, fill #0

## 2022-05-27 ENCOUNTER — Other Ambulatory Visit (HOSPITAL_COMMUNITY): Payer: Self-pay

## 2022-06-17 ENCOUNTER — Other Ambulatory Visit: Payer: Self-pay | Admitting: Oncology

## 2022-06-17 ENCOUNTER — Other Ambulatory Visit (HOSPITAL_COMMUNITY): Payer: Self-pay

## 2022-06-17 DIAGNOSIS — C61 Malignant neoplasm of prostate: Secondary | ICD-10-CM

## 2022-06-17 MED ORDER — ABIRATERONE ACETATE 250 MG PO TABS
ORAL_TABLET | Freq: Every day | ORAL | 0 refills | Status: DC
  Filled 2022-06-17: qty 120, 30d supply, fill #0

## 2022-06-20 ENCOUNTER — Other Ambulatory Visit: Payer: Self-pay

## 2022-06-20 ENCOUNTER — Other Ambulatory Visit: Payer: Self-pay | Admitting: Genetic Counselor

## 2022-06-20 ENCOUNTER — Inpatient Hospital Stay: Payer: Medicare HMO | Attending: Oncology | Admitting: Genetic Counselor

## 2022-06-20 ENCOUNTER — Inpatient Hospital Stay: Payer: Medicare HMO

## 2022-06-20 DIAGNOSIS — C7951 Secondary malignant neoplasm of bone: Secondary | ICD-10-CM | POA: Insufficient documentation

## 2022-06-20 DIAGNOSIS — Z79899 Other long term (current) drug therapy: Secondary | ICD-10-CM | POA: Insufficient documentation

## 2022-06-20 DIAGNOSIS — Z803 Family history of malignant neoplasm of breast: Secondary | ICD-10-CM | POA: Diagnosis not present

## 2022-06-20 DIAGNOSIS — C61 Malignant neoplasm of prostate: Secondary | ICD-10-CM | POA: Insufficient documentation

## 2022-06-20 LAB — CMP (CANCER CENTER ONLY)
ALT: 21 U/L (ref 0–44)
AST: 16 U/L (ref 15–41)
Albumin: 4.4 g/dL (ref 3.5–5.0)
Alkaline Phosphatase: 137 U/L — ABNORMAL HIGH (ref 38–126)
Anion gap: 11 (ref 5–15)
BUN: 19 mg/dL (ref 8–23)
CO2: 23 mmol/L (ref 22–32)
Calcium: 9.8 mg/dL (ref 8.9–10.3)
Chloride: 108 mmol/L (ref 98–111)
Creatinine: 1.14 mg/dL (ref 0.61–1.24)
GFR, Estimated: >60 mL/min (ref >60)
Glucose, Bld: 117 mg/dL — ABNORMAL HIGH (ref 70–99)
Potassium: 3.9 mmol/L (ref 3.5–5.1)
Sodium: 142 mmol/L (ref 135–145)
Total Bilirubin: 0.4 mg/dL (ref 0.3–1.2)
Total Protein: 7 g/dL (ref 6.5–8.1)

## 2022-06-20 LAB — CBC WITH DIFFERENTIAL (CANCER CENTER ONLY)
Abs Immature Granulocytes: 0.06 10*3/uL (ref 0.00–0.07)
Basophils Absolute: 0.1 10*3/uL (ref 0.0–0.1)
Basophils Relative: 1 %
Eosinophils Absolute: 0.3 10*3/uL (ref 0.0–0.5)
Eosinophils Relative: 3 %
HCT: 41.1 % (ref 39.0–52.0)
Hemoglobin: 14.1 g/dL (ref 13.0–17.0)
Immature Granulocytes: 1 %
Lymphocytes Relative: 33 %
Lymphs Abs: 3.1 10*3/uL (ref 0.7–4.0)
MCH: 29.4 pg (ref 26.0–34.0)
MCHC: 34.3 g/dL (ref 30.0–36.0)
MCV: 85.6 fL (ref 80.0–100.0)
Monocytes Absolute: 0.7 10*3/uL (ref 0.1–1.0)
Monocytes Relative: 8 %
Neutro Abs: 5.2 10*3/uL (ref 1.7–7.7)
Neutrophils Relative %: 54 %
Platelet Count: 241 10*3/uL (ref 150–400)
RBC: 4.8 MIL/uL (ref 4.22–5.81)
RDW: 12.9 % (ref 11.5–15.5)
WBC Count: 9.5 10*3/uL (ref 4.0–10.5)
nRBC: 0 % (ref 0.0–0.2)

## 2022-06-20 LAB — GENETIC SCREENING ORDER

## 2022-06-21 ENCOUNTER — Encounter: Payer: Self-pay | Admitting: Genetic Counselor

## 2022-06-21 ENCOUNTER — Encounter: Payer: Self-pay | Admitting: Oncology

## 2022-06-21 DIAGNOSIS — Z803 Family history of malignant neoplasm of breast: Secondary | ICD-10-CM

## 2022-06-21 HISTORY — DX: Family history of malignant neoplasm of breast: Z80.3

## 2022-06-21 LAB — PROSTATE-SPECIFIC AG, SERUM (LABCORP): Prostate Specific Ag, Serum: <0.1 ng/mL (ref 0.0–4.0)

## 2022-06-21 NOTE — Progress Notes (Signed)
REFERRING PROVIDER: Wyatt Portela, MD [place],  [place] [address]  PRIMARY PROVIDER:  Tsosie Billing, MD   PRIMARY REASON FOR VISIT:  1. Prostate cancer ([place])   2. Family history of breast cancer      HISTORY OF PRESENT ILLNESS:   Terry Hinton, a 66 y.o. male, was seen for a [place] cancer genetics consultation at the request of Dr. Alen Blew due to a personal history of metastatic prostate cancer.  Terry Hinton presents to clinic today to discuss the possibility of a hereditary predisposition to cancer, to discuss genetic testing, and to further clarify his future cancer risks, as well as potential cancer risks for family members.   Terry Hinton is a 66 year old male with castration-sensitive advanced prostate cancer with disease to the bone and lymphadenopathy.  His prostate cancer was first diagnosed at age 89.     Past Medical History:  Diagnosis Date   Allergy    Anemia    Arthritis    knee   Back pain    back, neck and leg problems, pt. stated   Enlarged prostate    pt. stated   Family history of breast cancer 06/21/2022   Hypertension    Leg injury    Prostate cancer ([place])    Mets to Bone     Past Surgical History:  Procedure Laterality Date   NO PAST SURGERIES     PROSTATE BIOPSY      FAMILY HISTORY:  We obtained a detailed, 4-generation family history.  Significant diagnoses are listed below: Family History  Problem Relation Age of Onset   Breast cancer Mother        d. 78     Terry Hinton is unaware of previous family history of genetic testing for hereditary cancer risks.  There is no reported Ashkenazi Jewish ancestry. There is no known consanguinity.    He has limited information about his paternal family as he hasn't seen his dad since he was a teenager.  He regularly keeps up with sister maternal half sister who lives in [place].   GENETIC COUNSELING ASSESSMENT: Terry Hinton is a 66 y.o. male with a personal and family  history which is somewhat suggestive of a hereditary cancer syndrome and predisposition to cancer given his diagnosis of metastatic prostate cancer, the presence of breast cancer in his mother, and the limited information about his paternal family history. We, therefore, discussed and recommended the following at today's visit.   DISCUSSION: We discussed that 5 - 10% of cancer is hereditary.  Most cases of hereditary prostate cancer are associated with mutations in BRCA1/2.  There are other genes that can be associated with hereditary prostate cancer syndromes.  We discussed that testing is beneficial for several reasons including knowing how to follow individuals for their cancer risks, identifying whether potential treatment options, such as PARP inhibitors, would be beneficial, and understanding if other family members could be at risk for cancer and allowing them to undergo genetic testing.   We reviewed the characteristics, features and inheritance patterns of hereditary cancer syndromes. We also discussed genetic testing, including the appropriate family members to test, the process of testing, insurance coverage and turn-around-time for results. We discussed the implications of a negative, positive, carrier and/or variant of uncertain significant result. We recommended Terry Hinton pursue genetic testing for a panel that includes genes associated with prostate cancer, breast cancer, and other cancers given his limited information about family history.  The CancerNext-Expanded gene panel offered by [facility] and includes sequencing, rearrangement, and RNA analysis for the following 77 genes: AIP, ALK, APC, ATM, AXIN2, BAP1, BARD1, BLM, BMPR1A, BRCA1, BRCA2, BRIP1, CDC73, CDH1, CDK4, CDKN1B, CDKN2A, CHEK2, CTNNA1, DICER1, FANCC, FH, FLCN, GALNT12, KIF1B, LZTR1, MAX, MEN1, MET, MLH1, MSH2, MSH3, MSH6, MUTYH, NBN, NF1, NF2, NTHL1, PALB2, PHOX2B, PMS2, POT1, PRKAR1A, PTCH1, PTEN, RAD51C, RAD51D, RB1,  RECQL, RET, SDHA, SDHAF2, SDHB, SDHC, SDHD, SMAD4, SMARCA4, SMARCB1, SMARCE1, STK11, SUFU, TMEM127, TP53, TSC1, TSC2, VHL and XRCC2 (sequencing and deletion/duplication); EGFR, EGLN1, HOXB13, KIT, MITF, PDGFRA, POLD1, and POLE (sequencing only); EPCAM and GREM1 (deletion/duplication only).   Based on Terry Hinton personal history of metastatic prostate cancer, he meets medical criteria for genetic testing.   PLAN: After considering the risks, benefits, and limitations, Terry Hinton provided informed consent to pursue genetic testing and the blood sample was sent to [facility] for analysis of the CancerNext-Expanded +RNAinsight Panel. Results should be available within approximately 3 weeks' time, at which point they will be disclosed by telephone to Terry Hinton, as will any additional recommendations warranted by these results. Terry Hinton will receive a summary of his genetic counseling visit and a copy of his results once available. This information will also be available in Epic.   Mr. Polio questions were answered to his satisfaction today. Our contact information was provided should additional questions or concerns arise. Thank you for the referral and allowing [place] to share in the care of your patient.   Terry Hinton, [place], [facility] Genetic Counselor [email or web address] (P) [phone]  The patient was seen for a total of 25 minutes in face-to-face genetic counseling.  Patient was seen alone.  Drs. Lindi Adie and/or Burr Medico were available to discuss this case as needed.    _______________________________________________________________________ For Office Staff:  Number of people involved in session: 1 Was an Intern/ student involved with case: no

## 2022-06-25 ENCOUNTER — Other Ambulatory Visit (HOSPITAL_COMMUNITY): Payer: Self-pay

## 2022-07-11 ENCOUNTER — Encounter: Payer: Self-pay | Admitting: Genetic Counselor

## 2022-07-11 ENCOUNTER — Telehealth: Payer: Self-pay | Admitting: Genetic Counselor

## 2022-07-11 DIAGNOSIS — Z1379 Encounter for other screening for genetic and chromosomal anomalies: Secondary | ICD-10-CM | POA: Insufficient documentation

## 2022-07-11 NOTE — Telephone Encounter (Signed)
Contacted patient in attempt to disclose results of genetic testing.  LVM with contact information requesting a call back.  

## 2022-07-18 ENCOUNTER — Other Ambulatory Visit (HOSPITAL_COMMUNITY): Payer: Self-pay

## 2022-07-18 ENCOUNTER — Other Ambulatory Visit: Payer: Self-pay | Admitting: Oncology

## 2022-07-18 DIAGNOSIS — C61 Malignant neoplasm of prostate: Secondary | ICD-10-CM

## 2022-07-18 MED ORDER — ABIRATERONE ACETATE 250 MG PO TABS
ORAL_TABLET | Freq: Every day | ORAL | 0 refills | Status: DC
  Filled 2022-07-18: qty 120, 30d supply, fill #0

## 2022-07-22 ENCOUNTER — Other Ambulatory Visit (HOSPITAL_COMMUNITY): Payer: Self-pay

## 2022-08-08 NOTE — Telephone Encounter (Signed)
Second attempt at contacting patient to disclose genetics results.  Unable to LVM given full mailbox.

## 2022-08-12 ENCOUNTER — Other Ambulatory Visit (HOSPITAL_COMMUNITY): Payer: Self-pay

## 2022-08-12 ENCOUNTER — Other Ambulatory Visit: Payer: Self-pay | Admitting: Oncology

## 2022-08-12 DIAGNOSIS — C61 Malignant neoplasm of prostate: Secondary | ICD-10-CM

## 2022-08-12 MED ORDER — ABIRATERONE ACETATE 250 MG PO TABS
ORAL_TABLET | Freq: Every day | ORAL | 0 refills | Status: DC
  Filled 2022-08-12: qty 120, 30d supply, fill #0

## 2022-08-13 ENCOUNTER — Other Ambulatory Visit (HOSPITAL_COMMUNITY): Payer: Self-pay

## 2022-08-14 ENCOUNTER — Other Ambulatory Visit (HOSPITAL_COMMUNITY): Payer: Self-pay

## 2022-09-09 ENCOUNTER — Other Ambulatory Visit (HOSPITAL_COMMUNITY): Payer: Self-pay

## 2022-09-09 ENCOUNTER — Other Ambulatory Visit: Payer: Self-pay | Admitting: Oncology

## 2022-09-09 DIAGNOSIS — C61 Malignant neoplasm of prostate: Secondary | ICD-10-CM

## 2022-09-09 MED ORDER — ABIRATERONE ACETATE 250 MG PO TABS
ORAL_TABLET | Freq: Every day | ORAL | 0 refills | Status: DC
  Filled 2022-09-09: qty 120, 30d supply, fill #0

## 2022-09-10 ENCOUNTER — Inpatient Hospital Stay: Payer: Medicare HMO

## 2022-09-10 ENCOUNTER — Inpatient Hospital Stay (HOSPITAL_BASED_OUTPATIENT_CLINIC_OR_DEPARTMENT_OTHER): Payer: Medicare HMO | Admitting: Oncology

## 2022-09-10 ENCOUNTER — Inpatient Hospital Stay: Payer: Medicare HMO | Attending: Oncology

## 2022-09-10 VITALS — BP 138/95 | HR 112 | Temp 98.2°F | Resp 17 | Wt 286.0 lb

## 2022-09-10 DIAGNOSIS — C61 Malignant neoplasm of prostate: Secondary | ICD-10-CM | POA: Diagnosis present

## 2022-09-10 DIAGNOSIS — C779 Secondary and unspecified malignant neoplasm of lymph node, unspecified: Secondary | ICD-10-CM | POA: Diagnosis not present

## 2022-09-10 DIAGNOSIS — Z5111 Encounter for antineoplastic chemotherapy: Secondary | ICD-10-CM | POA: Insufficient documentation

## 2022-09-10 DIAGNOSIS — C7951 Secondary malignant neoplasm of bone: Secondary | ICD-10-CM | POA: Insufficient documentation

## 2022-09-10 DIAGNOSIS — E878 Other disorders of electrolyte and fluid balance, not elsewhere classified: Secondary | ICD-10-CM | POA: Diagnosis not present

## 2022-09-10 DIAGNOSIS — I10 Essential (primary) hypertension: Secondary | ICD-10-CM | POA: Insufficient documentation

## 2022-09-10 DIAGNOSIS — Z79899 Other long term (current) drug therapy: Secondary | ICD-10-CM | POA: Insufficient documentation

## 2022-09-10 DIAGNOSIS — R609 Edema, unspecified: Secondary | ICD-10-CM | POA: Insufficient documentation

## 2022-09-10 LAB — CMP (CANCER CENTER ONLY)
ALT: 22 U/L (ref 0–44)
AST: 12 U/L — ABNORMAL LOW (ref 15–41)
Albumin: 4.5 g/dL (ref 3.5–5.0)
Alkaline Phosphatase: 138 U/L — ABNORMAL HIGH (ref 38–126)
Anion gap: 9 (ref 5–15)
BUN: 16 mg/dL (ref 8–23)
CO2: 27 mmol/L (ref 22–32)
Calcium: 10 mg/dL (ref 8.9–10.3)
Chloride: 104 mmol/L (ref 98–111)
Creatinine: 1.16 mg/dL (ref 0.61–1.24)
GFR, Estimated: >60 mL/min (ref >60)
Glucose, Bld: 109 mg/dL — ABNORMAL HIGH (ref 70–99)
Potassium: 3.9 mmol/L (ref 3.5–5.1)
Sodium: 140 mmol/L (ref 135–145)
Total Bilirubin: 0.4 mg/dL (ref 0.3–1.2)
Total Protein: 7 g/dL (ref 6.5–8.1)

## 2022-09-10 LAB — CBC WITH DIFFERENTIAL (CANCER CENTER ONLY)
Abs Immature Granulocytes: 0.04 10*3/uL (ref 0.00–0.07)
Basophils Absolute: 0.1 10*3/uL (ref 0.0–0.1)
Basophils Relative: 1 %
Eosinophils Absolute: 0.5 10*3/uL (ref 0.0–0.5)
Eosinophils Relative: 5 %
HCT: 41.5 % (ref 39.0–52.0)
Hemoglobin: 13.9 g/dL (ref 13.0–17.0)
Immature Granulocytes: 0 %
Lymphocytes Relative: 36 %
Lymphs Abs: 3.2 10*3/uL (ref 0.7–4.0)
MCH: 29.4 pg (ref 26.0–34.0)
MCHC: 33.5 g/dL (ref 30.0–36.0)
MCV: 87.9 fL (ref 80.0–100.0)
Monocytes Absolute: 0.7 10*3/uL (ref 0.1–1.0)
Monocytes Relative: 7 %
Neutro Abs: 4.6 10*3/uL (ref 1.7–7.7)
Neutrophils Relative %: 51 %
Platelet Count: 246 10*3/uL (ref 150–400)
RBC: 4.72 MIL/uL (ref 4.22–5.81)
RDW: 12.7 % (ref 11.5–15.5)
WBC Count: 9 10*3/uL (ref 4.0–10.5)
nRBC: 0 % (ref 0.0–0.2)

## 2022-09-10 MED ORDER — LEUPROLIDE ACETATE (4 MONTH) 30 MG ~~LOC~~ KIT
30.0000 mg | PACK | Freq: Once | SUBCUTANEOUS | Status: AC
  Administered 2022-09-10: 30 mg via SUBCUTANEOUS
  Filled 2022-09-10: qty 30

## 2022-09-10 NOTE — Progress Notes (Signed)
Hematology and Oncology Follow Up Visit  Terry Hinton [id number] 1956/08/09 66 y.o. 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  He has castration-sensitive after presenting with PSA 2078, Gleason score was 4+5 = 9.   Prior Therapy:   Firmagon 240 mg given in 2015.  Patient failed to follow-up after that.  Current therapy:   He is currently on Eligard 30 mg every 4 months.  This is to be continued indefinitely I will receive his next injection today.  Zytiga 1000 mg daily with prednisone 5 mg daily started in March 2019.  Interim History: Mr. Terry Hinton is here for a follow-up visit.  Since last visit, he reports feeling well without any complaints.  He remains active and attends to activities of daily living.  Formal status quality of life remains unchanged.  He denies any hospitalizations or illnesses.  He denies any complications related to Zytiga including fatigue, edema or increase in his blood pressure.         Medications: Updated on review. Current Outpatient Medications  Medication Sig Dispense Refill   abiraterone acetate (ZYTIGA) 250 MG tablet TAKE 4 TABLETS (1,000 MG TOTAL) BY MOUTH DAILY. TAKE ON AN EMPTY STOMACH 1 HOUR BEFORE OR 2 HOURS AFTER A MEAL 120 tablet 0   blood glucose meter kit and supplies Dispense based on patient and insurance preference. Use up to four times daily as directed. (FOR ICD-10 E10.9, E11.9). 1 each 0   cholecalciferol (VITAMIN D) 1000 units tablet Take 1,000 Units by mouth daily.     cyclobenzaprine (FLEXERIL) 5 MG tablet Take 5 mg by mouth 3 (three) times daily as needed for muscle spasms.     finasteride (PROSCAR) 5 MG tablet Take 5 mg by mouth daily.     insulin detemir (LEVEMIR) 100 UNIT/ML injection Inject 0.3 mLs (30 Units total) into the skin 2 (two) times daily. 10 mL 11    insulin regular (NOVOLIN R) 100 units/mL injection For glucose 121 to 150 use two units, for 151 to 200 use four units, for 201 to 250 use six units, for 251 to 300 use ten units and for 301 to 350 use twelve units, for 351 or greater use fourteen units. 10 mL 3   INSULIN SYRINGE .5CC/29G 29G X 1/2" 0.5 ML MISC 0.5 mLs by Does not apply route 5 (five) times daily. 200 each 5   latanoprost (XALATAN) 0.005 % ophthalmic solution Place 1 drop into both eyes at bedtime.     lisinopril-hydrochlorothiazide (PRINZIDE,ZESTORETIC) 10-12.5 MG tablet Take 1 tablet by mouth daily.     OVER THE COUNTER MEDICATION Take 1 tablet by mouth daily.     potassium chloride SA (K-DUR,KLOR-CON) 20 MEQ tablet Take 1 tablet (20 mEq total) by mouth daily. 30 tablet 0   predniSONE (DELTASONE) 5 MG tablet Take 1 tablet (5 mg total) by mouth daily with breakfast. 90 tablet 3   tamsulosin (FLOMAX) 0.4 MG CAPS capsule Take 0.4 mg by mouth daily.   3   traMADol (ULTRAM) 50 MG tablet Take 50 mg by mouth every 6 (six) hours as needed (Pain).     No current facility-administered medications for this visit.     Allergies: No Known Allergies      Physical Exam:   Blood pressure (!) 138/95, pulse (!) 112, temperature 98.2 F (36.8 C), temperature source Temporal, resp. rate  17, weight 286 lb (129.7 kg), SpO2 97 %.    ECOG: 1     General appearance: Alert, awake without any distress. Head: Atraumatic without abnormalities Oropharynx: Without any thrush or ulcers. Eyes: No scleral icterus. Lymph nodes: No lymphadenopathy noted in the cervical, supraclavicular, or axillary nodes Heart:regular rate and rhythm, without any murmurs or gallops.   Lung: Clear to auscultation without any rhonchi, wheezes or dullness to percussion. Abdomin: Soft, nontender without any shifting dullness or ascites. Musculoskeletal: No clubbing or cyanosis. Neurological: No motor or sensory deficits. Skin: No rashes or  lesions.        .     Lab Results: Lab Results  Component Value Date   WBC 9.0 09/10/2022   HGB 13.9 09/10/2022   HCT 41.5 09/10/2022   MCV 87.9 09/10/2022   PLT 246 09/10/2022     Chemistry      Component Value Date/Time   NA 142 06/20/2022 1232   K 3.9 06/20/2022 1232   CL 108 06/20/2022 1232   CO2 23 06/20/2022 1232   BUN 19 06/20/2022 1232   CREATININE 1.14 06/20/2022 1232      Component Value Date/Time   CALCIUM 9.8 06/20/2022 1232   ALKPHOS 137 (H) 06/20/2022 1232   AST 16 06/20/2022 1232   ALT 21 06/20/2022 1232   BILITOT 0.4 06/20/2022 1232         Latest Reference Range & Units 09/06/21 11:40 01/04/22 11:36  Prostate Specific Ag, Serum 0.0 - 4.0 ng/mL <0.1 <0.1     Assessment and plan:  66 year old man with:   1.  Advanced prostate cancer with disease to the bone and lymphadenopathy diagnosed in 2019.  He has castration-sensitive disease with PSA that is undetectable.   He continues to tolerate Zytiga and I recommended continuing this treatment indefinitely.  Switching to Taxotere chemotherapy or a PARP inhibitor could be considered if his PSA starts to rise in the future.  Long-term complication related to Zytiga including hypertension, edema and electrolyte imbalance were reiterated.   2. Androgen deprivation therapy: He will receive Eligard today and repeated in 4 months.  Complication clinic weight gain hot flashes sexual dysfunction were discussed.     3. Bone directed therapy: I recommended continuing calcium and vitamin D supplements.  [name] could be considered in the future after obtain dental clearance if he has worsening bone disease.   4. Prognosis: Therapy remains palliative although aggressive measures are warranted given his excellent pulm status and current response.     5. Follow-up: In 4 months for repeat evaluation and next Eligard injection.   30  minutes were dedicated to this visit.  Time spent on updating disease  status, treatment choices and outlining future plan of care review.     Zola Button, MD 11/7/202311:28 AM

## 2022-09-11 ENCOUNTER — Telehealth: Payer: Self-pay | Admitting: *Deleted

## 2022-09-11 LAB — PROSTATE-SPECIFIC AG, SERUM (LABCORP): Prostate Specific Ag, Serum: <0.1 ng/mL (ref 0.0–4.0)

## 2022-09-11 NOTE — Telephone Encounter (Addendum)
Contacted patient per Dr. Hazeline Junker directions with message below. Patient verbalized understanding and expressed appreciation for the call.   ----- Message from Wyatt Portela, MD sent at 09/11/2022  8:23 AM EST ----- Please let him know his PSA is still low

## 2022-09-12 ENCOUNTER — Ambulatory Visit: Payer: Medicare HMO | Admitting: Oncology

## 2022-09-12 ENCOUNTER — Ambulatory Visit: Payer: Medicare HMO

## 2022-09-12 ENCOUNTER — Other Ambulatory Visit: Payer: Medicare HMO

## 2022-09-17 ENCOUNTER — Ambulatory Visit: Payer: Self-pay | Admitting: Genetic Counselor

## 2022-09-17 ENCOUNTER — Telehealth: Payer: Self-pay | Admitting: Genetic Counselor

## 2022-09-17 DIAGNOSIS — Z1379 Encounter for other screening for genetic and chromosomal anomalies: Secondary | ICD-10-CM

## 2022-09-17 DIAGNOSIS — Z803 Family history of malignant neoplasm of breast: Secondary | ICD-10-CM

## 2022-09-17 DIAGNOSIS — C61 Malignant neoplasm of prostate: Secondary | ICD-10-CM

## 2022-09-17 NOTE — Progress Notes (Signed)
HPI:   Terry Hinton was previously seen in the [place] clinic due to a personal history of metastatic prostate cancer and concerns regarding a hereditary predisposition to cancer. Please refer to our prior cancer genetics clinic note for more information regarding our discussion, assessment and recommendations, at the time. Terry Hinton recent genetic test results were disclosed to him, as were recommendations warranted by these results. These results and recommendations are discussed in more detail below.  CANCER HISTORY:  Oncology History  Prostate cancer [facility])   Initial Diagnosis   Prostate cancer ([place])   07/03/2022 Genetic Testing   Negative genetic testing for [facility] CancerNext-Expanded +RNAinsight Panel.  Report date is July 03, 2022.   The CancerNext-Expanded gene panel offered by [facility] and includes sequencing, rearrangement, and RNA analysis for the following 77 genes: AIP, ALK, APC, ATM, AXIN2, BAP1, BARD1, BLM, BMPR1A, BRCA1, BRCA2, BRIP1, CDC73, CDH1, CDK4, CDKN1B, CDKN2A, CHEK2, CTNNA1, DICER1, FANCC, FH, FLCN, GALNT12, KIF1B, LZTR1, MAX, MEN1, MET, MLH1, MSH2, MSH3, MSH6, MUTYH, NBN, NF1, NF2, NTHL1, PALB2, PHOX2B, PMS2, POT1, PRKAR1A, PTCH1, PTEN, RAD51C, RAD51D, RB1, RECQL, RET, SDHA, SDHAF2, SDHB, SDHC, SDHD, SMAD4, SMARCA4, SMARCB1, SMARCE1, STK11, SUFU, TMEM127, TP53, TSC1, TSC2, VHL and XRCC2 (sequencing and deletion/duplication); EGFR, EGLN1, HOXB13, KIT, MITF, PDGFRA, POLD1, and POLE (sequencing only); EPCAM and GREM1 (deletion/duplication only).       FAMILY HISTORY:  We obtained a detailed, 4-generation family history.  Significant diagnoses are listed below:      Family History  Problem Relation Age of Onset   Breast cancer Mother          d. 80       Terry Hinton is unaware of previous family history of genetic testing for hereditary cancer risks.  There is no reported Ashkenazi Jewish ancestry. There is no known consanguinity.     He has  limited information about his paternal family as he hasn't seen his dad since he was a teenager.  He regularly keeps up with sister maternal half sister who lives in [place].   GENETIC TEST RESULTS:  The [facility] CancerNext-Expanded +RNAinsight Panel found no pathogenic mutations.   The CancerNext-Expanded gene panel offered by [facility] and includes sequencing, rearrangement, and RNA analysis for the following 77 genes: AIP, ALK, APC, ATM, AXIN2, BAP1, BARD1, BLM, BMPR1A, BRCA1, BRCA2, BRIP1, CDC73, CDH1, CDK4, CDKN1B, CDKN2A, CHEK2, CTNNA1, DICER1, FANCC, FH, FLCN, GALNT12, KIF1B, LZTR1, MAX, MEN1, MET, MLH1, MSH2, MSH3, MSH6, MUTYH, NBN, NF1, NF2, NTHL1, PALB2, PHOX2B, PMS2, POT1, PRKAR1A, PTCH1, PTEN, RAD51C, RAD51D, RB1, RECQL, RET, SDHA, SDHAF2, SDHB, SDHC, SDHD, SMAD4, SMARCA4, SMARCB1, SMARCE1, STK11, SUFU, TMEM127, TP53, TSC1, TSC2, VHL and XRCC2 (sequencing and deletion/duplication); EGFR, EGLN1, HOXB13, KIT, MITF, PDGFRA, POLD1, and POLE (sequencing only); EPCAM and GREM1 (deletion/duplication only).  .   The test report has been scanned into EPIC and is located under the Molecular Pathology section of the Results Review tab.  A portion of the result report is included below for reference. Genetic testing reported out on July 03, 2022.      Even though a pathogenic variant was not identified, possible explanations for the cancer in the family may include: There may be no hereditary risk for cancer in the family. The cancers in Terry Hinton and/or his family may be sporadic/familial or due to other genetic and environmental factors. There may be a gene mutation in one of these genes that current testing methods cannot detect but that chance is small. There could be  another gene that has not yet been discovered, or that we have not yet tested, that is responsible for the cancer diagnoses in the family.   Therefore, it is important to remain in touch with cancer genetics in the future  so that we can continue to offer Terry Hinton the most up to date genetic testing.     ADDITIONAL GENETIC TESTING:  We discussed with Terry Hinton that his genetic testing was fairly extensive.  If there are additional relevant genes identified to increase cancer risk that can be analyzed in the future, we would be happy to discuss and coordinate this testing at that time.     CANCER SCREENING RECOMMENDATIONS:  Terry Hinton test result is considered negative (normal).  This means that we have not identified a hereditary cause for his personal history of metastatic prostate cancer at this time.   RECOMMENDATIONS FOR FAMILY MEMBERS:   Since he did not inherit a identifiable mutation in a cancer predisposition gene included on this panel, his children could not have inherited a known mutation from him in one of these genes. Individuals in this family might be at some increased risk of developing cancer, over the general population risk, due to the family history of cancer.  Individuals in the family should notify their providers of the family history of cancer. We recommend women in this family have a yearly mammogram beginning at age 75, or 39 years younger than the earliest onset of cancer, an annual clinical breast exam, and perform monthly breast self-exams. Males in the family should speak with their providers about considering prostate cancer screening.   FOLLOW-UP:  Lastly, we discussed with Terry Hinton that cancer genetics is a rapidly advancing field and it is possible that new genetic tests will be appropriate for him and/or his family members in the future. We encouraged him to remain in contact with cancer genetics on an annual basis so we can update his personal and family histories and let him know of advances in cancer genetics that may benefit this family.   Our contact number was provided. Terry Hinton questions were answered to his satisfaction, and he knows he is welcome to call us at  anytime with additional questions or concerns.   Destany Severns M. Joette Catching, [place], [facility] Genetic Counselor [email or web address] (P) [phone]

## 2022-09-17 NOTE — Telephone Encounter (Signed)
Revealed negative genetic testing.    

## 2022-09-30 ENCOUNTER — Other Ambulatory Visit: Payer: Self-pay | Admitting: Oncology

## 2022-09-30 ENCOUNTER — Other Ambulatory Visit (HOSPITAL_COMMUNITY): Payer: Self-pay

## 2022-09-30 DIAGNOSIS — C61 Malignant neoplasm of prostate: Secondary | ICD-10-CM

## 2022-09-30 MED ORDER — ABIRATERONE ACETATE 250 MG PO TABS
ORAL_TABLET | Freq: Every day | ORAL | 0 refills | Status: DC
  Filled 2022-09-30: qty 120, 30d supply, fill #0

## 2022-10-07 ENCOUNTER — Other Ambulatory Visit (HOSPITAL_COMMUNITY): Payer: Self-pay

## 2022-10-08 ENCOUNTER — Other Ambulatory Visit (HOSPITAL_COMMUNITY): Payer: Self-pay

## 2022-10-25 ENCOUNTER — Other Ambulatory Visit (HOSPITAL_COMMUNITY): Payer: Self-pay

## 2022-10-29 ENCOUNTER — Other Ambulatory Visit (HOSPITAL_COMMUNITY): Payer: Self-pay

## 2022-10-29 ENCOUNTER — Other Ambulatory Visit: Payer: Self-pay | Admitting: Oncology

## 2022-10-29 DIAGNOSIS — C61 Malignant neoplasm of prostate: Secondary | ICD-10-CM

## 2022-10-29 MED ORDER — ABIRATERONE ACETATE 250 MG PO TABS
ORAL_TABLET | Freq: Every day | ORAL | 0 refills | Status: DC
  Filled 2022-10-29: qty 120, 30d supply, fill #0

## 2022-10-30 ENCOUNTER — Other Ambulatory Visit (HOSPITAL_COMMUNITY): Payer: Self-pay

## 2022-11-11 ENCOUNTER — Telehealth: Payer: Self-pay | Admitting: Hematology and Oncology

## 2022-11-11 NOTE — Telephone Encounter (Signed)
Called patient to r/s 3/11 appointment due to provider PAL. Patient r/s and notified.

## 2022-11-26 ENCOUNTER — Other Ambulatory Visit (HOSPITAL_COMMUNITY): Payer: Self-pay

## 2022-11-26 ENCOUNTER — Other Ambulatory Visit: Payer: Self-pay | Admitting: Oncology

## 2022-11-26 DIAGNOSIS — C61 Malignant neoplasm of prostate: Secondary | ICD-10-CM

## 2022-11-26 MED ORDER — ABIRATERONE ACETATE 250 MG PO TABS
ORAL_TABLET | Freq: Every day | ORAL | 0 refills | Status: DC
  Filled 2022-11-26: qty 120, 30d supply, fill #0

## 2022-11-27 ENCOUNTER — Other Ambulatory Visit (HOSPITAL_COMMUNITY): Payer: Self-pay

## 2022-11-27 ENCOUNTER — Other Ambulatory Visit: Payer: Self-pay

## 2022-12-18 ENCOUNTER — Other Ambulatory Visit (HOSPITAL_COMMUNITY): Payer: Self-pay

## 2022-12-23 ENCOUNTER — Other Ambulatory Visit (HOSPITAL_COMMUNITY): Payer: Self-pay

## 2022-12-25 ENCOUNTER — Other Ambulatory Visit (HOSPITAL_COMMUNITY): Payer: Self-pay

## 2022-12-26 ENCOUNTER — Other Ambulatory Visit (HOSPITAL_COMMUNITY): Payer: Self-pay

## 2022-12-26 ENCOUNTER — Other Ambulatory Visit: Payer: Self-pay | Admitting: Hematology and Oncology

## 2022-12-26 DIAGNOSIS — C61 Malignant neoplasm of prostate: Secondary | ICD-10-CM

## 2022-12-26 MED ORDER — ABIRATERONE ACETATE 250 MG PO TABS
ORAL_TABLET | Freq: Every day | ORAL | 0 refills | Status: DC
  Filled 2022-12-26: qty 120, 30d supply, fill #0

## 2023-01-09 ENCOUNTER — Telehealth: Payer: Self-pay | Admitting: Hematology and Oncology

## 2023-01-09 NOTE — Telephone Encounter (Signed)
Called patient regarding upcoming appointments, patient does not have voicemail set up. Calendar will be mailed.

## 2023-01-13 ENCOUNTER — Ambulatory Visit: Payer: Medicare HMO

## 2023-01-13 ENCOUNTER — Ambulatory Visit: Payer: Medicare HMO | Admitting: Hematology and Oncology

## 2023-01-13 ENCOUNTER — Other Ambulatory Visit: Payer: Medicare HMO

## 2023-01-14 ENCOUNTER — Ambulatory Visit: Payer: Medicare HMO

## 2023-01-14 ENCOUNTER — Other Ambulatory Visit: Payer: Medicare HMO

## 2023-01-14 ENCOUNTER — Ambulatory Visit: Payer: Medicare HMO | Admitting: Oncology

## 2023-01-15 ENCOUNTER — Other Ambulatory Visit (HOSPITAL_COMMUNITY): Payer: Self-pay

## 2023-01-16 ENCOUNTER — Other Ambulatory Visit: Payer: Self-pay

## 2023-01-16 ENCOUNTER — Inpatient Hospital Stay: Payer: Medicare HMO | Attending: Hematology and Oncology

## 2023-01-16 ENCOUNTER — Inpatient Hospital Stay (HOSPITAL_BASED_OUTPATIENT_CLINIC_OR_DEPARTMENT_OTHER): Payer: Medicare HMO | Admitting: Hematology and Oncology

## 2023-01-16 ENCOUNTER — Other Ambulatory Visit: Payer: Self-pay | Admitting: Hematology and Oncology

## 2023-01-16 ENCOUNTER — Inpatient Hospital Stay: Payer: Medicare HMO

## 2023-01-16 VITALS — BP 144/90 | HR 95 | Temp 98.3°F | Resp 16 | Wt 286.4 lb

## 2023-01-16 DIAGNOSIS — C61 Malignant neoplasm of prostate: Secondary | ICD-10-CM

## 2023-01-16 DIAGNOSIS — Z803 Family history of malignant neoplasm of breast: Secondary | ICD-10-CM | POA: Diagnosis not present

## 2023-01-16 DIAGNOSIS — Z5111 Encounter for antineoplastic chemotherapy: Secondary | ICD-10-CM | POA: Insufficient documentation

## 2023-01-16 DIAGNOSIS — Z7952 Long term (current) use of systemic steroids: Secondary | ICD-10-CM | POA: Diagnosis not present

## 2023-01-16 DIAGNOSIS — Z87891 Personal history of nicotine dependence: Secondary | ICD-10-CM | POA: Insufficient documentation

## 2023-01-16 DIAGNOSIS — Z832 Family history of diseases of the blood and blood-forming organs and certain disorders involving the immune mechanism: Secondary | ICD-10-CM | POA: Insufficient documentation

## 2023-01-16 DIAGNOSIS — Z79899 Other long term (current) drug therapy: Secondary | ICD-10-CM | POA: Insufficient documentation

## 2023-01-16 LAB — CBC WITH DIFFERENTIAL (CANCER CENTER ONLY)
Abs Immature Granulocytes: 0.07 10*3/uL (ref 0.00–0.07)
Basophils Absolute: 0.1 10*3/uL (ref 0.0–0.1)
Basophils Relative: 1 %
Eosinophils Absolute: 0.3 10*3/uL (ref 0.0–0.5)
Eosinophils Relative: 4 %
HCT: 40.7 % (ref 39.0–52.0)
Hemoglobin: 13.5 g/dL (ref 13.0–17.0)
Immature Granulocytes: 1 %
Lymphocytes Relative: 35 %
Lymphs Abs: 3.1 10*3/uL (ref 0.7–4.0)
MCH: 29 pg (ref 26.0–34.0)
MCHC: 33.2 g/dL (ref 30.0–36.0)
MCV: 87.5 fL (ref 80.0–100.0)
Monocytes Absolute: 0.7 10*3/uL (ref 0.1–1.0)
Monocytes Relative: 8 %
Neutro Abs: 4.7 10*3/uL (ref 1.7–7.7)
Neutrophils Relative %: 51 %
Platelet Count: 242 10*3/uL (ref 150–400)
RBC: 4.65 MIL/uL (ref 4.22–5.81)
RDW: 12.7 % (ref 11.5–15.5)
WBC Count: 8.9 10*3/uL (ref 4.0–10.5)
nRBC: 0 % (ref 0.0–0.2)

## 2023-01-16 LAB — CMP (CANCER CENTER ONLY)
ALT: 17 U/L (ref 0–44)
AST: 11 U/L — ABNORMAL LOW (ref 15–41)
Albumin: 4.6 g/dL (ref 3.5–5.0)
Alkaline Phosphatase: 144 U/L — ABNORMAL HIGH (ref 38–126)
Anion gap: 9 (ref 5–15)
BUN: 19 mg/dL (ref 8–23)
CO2: 27 mmol/L (ref 22–32)
Calcium: 10.1 mg/dL (ref 8.9–10.3)
Chloride: 105 mmol/L (ref 98–111)
Creatinine: 1.09 mg/dL (ref 0.61–1.24)
GFR, Estimated: >60 mL/min (ref >60)
Glucose, Bld: 133 mg/dL — ABNORMAL HIGH (ref 70–99)
Potassium: 3.9 mmol/L (ref 3.5–5.1)
Sodium: 141 mmol/L (ref 135–145)
Total Bilirubin: 0.3 mg/dL (ref 0.3–1.2)
Total Protein: 7 g/dL (ref 6.5–8.1)

## 2023-01-16 MED ORDER — LEUPROLIDE ACETATE (4 MONTH) 30 MG ~~LOC~~ KIT
30.0000 mg | PACK | Freq: Once | SUBCUTANEOUS | Status: AC
  Administered 2023-01-16: 30 mg via SUBCUTANEOUS
  Filled 2023-01-16: qty 30

## 2023-01-16 NOTE — Progress Notes (Signed)
[name]   Fax:([phone]  PROGRESS NOTE  Patient Care Team: Terry Billing, MD (Inactive) as PCP - General (Internal Medicine)  Hematological/Oncological History # Castrate Sensitive Advanced Prostate Cancer 2015: firmagon 240 mg. Lost to follow up.  2019: initial PSA 2078, Gleason score was 4+5 = 10 January 2018: started Zytiga 1000 mg PO and Eligard 30 mg q 4 months 09/10/2022: last visit with Dr. Alen Hinton 01/16/2023: establish care with Dr. Lorenso Hinton   Interval History:  Terry Hinton 67 y.o. male with medical history significant for castrate sensitive advanced prostate cancer who presents for a follow up visit. The patient's last visit was on 09/10/2022 with Dr. Alen Hinton. In the interim since the last visit he has continued on his Zytiga and prednisone therapy.  On exam today Terry Hinton reports that he is tolerating his Zytiga and Eligard with no difficulties.  He reports that he is not having any sweats, hot flashes, or other concerning symptoms.  He reports he is not having any nausea, vomiting or diarrhea.  He is having no new bone or back pain.  He reports he does have some occasional itching at the injection site but overall he is tolerating it without difficulty.  He reports he has not had any recent infectious symptoms.  Overall he is willing and able to proceed with Zytiga and Eligard therapy at this time.  MEDICAL HISTORY:  Past Medical History:  Diagnosis Date   Allergy    Anemia    Arthritis    knee   Back pain    back, neck and leg problems, pt. stated   Enlarged prostate    pt. stated   Family history of breast cancer 06/21/2022   Hypertension    Leg injury    Prostate cancer ([place])    Mets to Bone     SURGICAL HISTORY: Past Surgical History:  Procedure Laterality Date   NO PAST SURGERIES     PROSTATE BIOPSY      SOCIAL HISTORY: Social History   Socioeconomic History   Marital status: Widowed    Spouse  name: Not on file   Number of children: Not on file   Years of education: Not on file   Highest education level: Not on file  Occupational History   Not on file  Tobacco Use   Smoking status: Former    Types: Cigarettes    Quit date: 05/08/2014    Years since quitting: 8.7   Smokeless tobacco: Never  Substance and Sexual Activity   Alcohol use: Yes    Comment: beer occasionally    Drug use: No   Sexual activity: Not Currently  Other Topics Concern   Not on file  Social History Narrative   Not on file   Social Determinants of Health   Financial Resource Strain: Not on file  Food Insecurity: Not on file  Transportation Needs: Not on file  Physical Activity: Not on file  Stress: Not on file  Social Connections: Not on file  Intimate Partner Violence: Not on file    FAMILY HISTORY: Family History  Problem Relation Age of Onset   Breast cancer Mother        d. 25   Sickle cell anemia Sister        d. 40   Colon cancer Neg Hx    Colon polyps Neg Hx    Esophageal cancer Neg Hx    Rectal cancer Neg Hx    Stomach  cancer Neg Hx     ALLERGIES:  has No Known Allergies.  MEDICATIONS:  Current Outpatient Medications  Medication Sig Dispense Refill   abiraterone acetate (ZYTIGA) 250 MG tablet TAKE 4 TABLETS (1,000 MG TOTAL) BY MOUTH DAILY. TAKE ON AN EMPTY STOMACH 1 HOUR BEFORE OR 2 HOURS AFTER A MEAL 120 tablet 0   blood glucose meter kit and supplies Dispense based on patient and insurance preference. Use up to four times daily as directed. (FOR ICD-10 E10.9, E11.9). 1 each 0   cholecalciferol (VITAMIN D) 1000 units tablet Take 1,000 Units by mouth daily.     cyclobenzaprine (FLEXERIL) 5 MG tablet Take 5 mg by mouth 3 (three) times daily as needed for muscle spasms.     finasteride (PROSCAR) 5 MG tablet Take 5 mg by mouth daily.     insulin detemir (LEVEMIR) 100 UNIT/ML injection Inject 0.3 mLs (30 Units total) into the skin 2 (two) times daily. 10 mL 11   insulin regular  (NOVOLIN R) 100 units/mL injection For glucose 121 to 150 use two units, for 151 to 200 use four units, for 201 to 250 use six units, for 251 to 300 use ten units and for 301 to 350 use twelve units, for 351 or greater use fourteen units. 10 mL 3   INSULIN SYRINGE .5CC/29G 29G X 1/2" 0.5 ML MISC 0.5 mLs by Does not apply route 5 (five) times daily. 200 each 5   latanoprost (XALATAN) 0.005 % ophthalmic solution Place 1 drop into both eyes at bedtime.     lisinopril-hydrochlorothiazide (PRINZIDE,ZESTORETIC) 10-12.5 MG tablet Take 1 tablet by mouth daily.     OVER THE COUNTER MEDICATION Take 1 tablet by mouth daily.     potassium chloride SA (K-DUR,KLOR-CON) 20 MEQ tablet Take 1 tablet (20 mEq total) by mouth daily. 30 tablet 0   predniSONE (DELTASONE) 5 MG tablet Take 1 tablet (5 mg total) by mouth daily with breakfast. 90 tablet 3   tamsulosin (FLOMAX) 0.4 MG CAPS capsule Take 0.4 mg by mouth daily.   3   traMADol (ULTRAM) 50 MG tablet Take 50 mg by mouth every 6 (six) hours as needed (Pain).     No current facility-administered medications for this visit.    REVIEW OF SYSTEMS:   Constitutional: ( - ) fevers, ( - )  chills , ( - ) night sweats Eyes: ( - ) blurriness of vision, ( - ) double vision, ( - ) watery eyes Ears, nose, mouth, throat, and face: ( - ) mucositis, ( - ) sore throat Respiratory: ( - ) cough, ( - ) dyspnea, ( - ) wheezes Cardiovascular: ( - ) palpitation, ( - ) chest discomfort, ( - ) lower extremity swelling Gastrointestinal:  ( - ) nausea, ( - ) heartburn, ( - ) change in bowel habits Skin: ( - ) abnormal skin rashes Lymphatics: ( - ) new lymphadenopathy, ( - ) easy bruising Neurological: ( - ) numbness, ( - ) tingling, ( - ) new weaknesses Behavioral/Psych: ( - ) mood change, ( - ) new changes  All other systems were reviewed with the patient and are negative.  PHYSICAL EXAMINATION: ECOG PERFORMANCE STATUS: 0 - Asymptomatic  Vitals:   01/16/23 1440  BP: (!) 144/90   Pulse: 95  Resp: 16  Temp: 98.3 F (36.8 C)  SpO2: 96%   Filed Weights   01/16/23 1440  Weight: 286 lb 6.4 oz (129.9 kg)    GENERAL: Well-appearing elderly male, alert,  no distress and comfortable SKIN: skin color, texture, turgor are normal, no rashes or significant lesions EYES: conjunctiva are pink and non-injected, sclera clear LUNGS: clear to auscultation and percussion with normal breathing effort HEART: regular rate & rhythm and no murmurs and no lower extremity edema Musculoskeletal: no cyanosis of digits and no clubbing  PSYCH: alert & oriented x 3, fluent speech NEURO: no focal motor/sensory deficits  LABORATORY DATA:  I have reviewed the data as listed    Latest Ref Rng & Units 01/16/2023   11:51 AM 09/10/2022   11:04 AM 06/20/2022   12:32 PM  CBC  WBC 4.0 - 10.5 K/uL 8.9  9.0  9.5   Hemoglobin 13.0 - 17.0 g/dL 13.5  13.9  14.1   Hematocrit 39.0 - 52.0 % 40.7  41.5  41.1   Platelets 150 - 400 K/uL 242  246  241        Latest Ref Rng & Units 01/16/2023   11:51 AM 09/10/2022   11:04 AM 06/20/2022   12:32 PM  CMP  Glucose 70 - 99 mg/dL 133  109  117   BUN 8 - 23 mg/dL 19  16  19    Creatinine 0.61 - 1.24 mg/dL 1.09  1.16  1.14   Sodium 135 - 145 mmol/L 141  140  142   Potassium 3.5 - 5.1 mmol/L 3.9  3.9  3.9   Chloride 98 - 111 mmol/L 105  104  108   CO2 22 - 32 mmol/L 27  27  23    Calcium 8.9 - 10.3 mg/dL 10.1  10.0  9.8   Total Protein 6.5 - 8.1 g/dL 7.0  7.0  7.0   Total Bilirubin 0.3 - 1.2 mg/dL 0.3  0.4  0.4   Alkaline Phos 38 - 126 U/L 144  138  137   AST 15 - 41 U/L 11  12  16    ALT 0 - 44 U/L 17  22  21     RADIOGRAPHIC STUDIES: No results found.  ASSESSMENT & PLAN Terry Hinton 67 y.o. male with medical history significant for castrate sensitive advanced prostate cancer who presents for a follow up visit.   # Castrate Sensitive Advanced Prostate Cancer -- Continue Eligard 30 mg every 4 months indefinitely. -- Continue Zytiga 1000 mg p.o. daily  with prednisone 5 mg -- Labs today show white blood cell count 8.9, hemoglobin 13.5, MCV 87.5, and platelets of 242 with a creatinine of 1.09. --PSA <0.1 on 01/16/2023 -- Return to clinic in 4 months time for continued monitoring on Zytiga and continued Eligard shots.  No orders of the defined types were placed in this encounter.   All questions were answered. The patient knows to call the clinic with any problems, questions or concerns.  A total of more than 40 minutes were spent on this encounter with face-to-face time and non-face-to-face time, including preparing to see the patient, ordering tests and/or medications, counseling the patient and coordination of care as outlined above.   Ledell Peoples, MD Department of Hematology/Oncology [place] [facility] Phone: [phone] Pager: [phone] Email: [email or web address]  01/28/2023 1:38 PM

## 2023-01-17 LAB — PROSTATE-SPECIFIC AG, SERUM (LABCORP): Prostate Specific Ag, Serum: <0.1 ng/mL (ref 0.0–4.0)

## 2023-01-17 LAB — TESTOSTERONE: Testosterone: <3 ng/dL — ABNORMAL LOW (ref 264–916)

## 2023-01-20 ENCOUNTER — Other Ambulatory Visit (HOSPITAL_COMMUNITY): Payer: Self-pay

## 2023-01-20 ENCOUNTER — Other Ambulatory Visit: Payer: Self-pay | Admitting: Hematology and Oncology

## 2023-01-20 ENCOUNTER — Other Ambulatory Visit: Payer: Self-pay

## 2023-01-20 DIAGNOSIS — C61 Malignant neoplasm of prostate: Secondary | ICD-10-CM

## 2023-01-20 MED ORDER — ABIRATERONE ACETATE 250 MG PO TABS
ORAL_TABLET | Freq: Every day | ORAL | 0 refills | Status: DC
  Filled 2023-01-20: qty 120, 30d supply, fill #0

## 2023-01-21 ENCOUNTER — Other Ambulatory Visit: Payer: Self-pay

## 2023-01-28 ENCOUNTER — Encounter: Payer: Self-pay | Admitting: Hematology and Oncology

## 2023-01-29 ENCOUNTER — Telehealth: Payer: Self-pay | Admitting: Hematology and Oncology

## 2023-01-29 NOTE — Telephone Encounter (Signed)
Per 2/27 IB reached out to patient to schedule.

## 2023-02-11 ENCOUNTER — Other Ambulatory Visit (HOSPITAL_COMMUNITY): Payer: Self-pay

## 2023-02-14 ENCOUNTER — Other Ambulatory Visit (HOSPITAL_COMMUNITY): Payer: Self-pay

## 2023-02-17 ENCOUNTER — Other Ambulatory Visit (HOSPITAL_COMMUNITY): Payer: Self-pay

## 2023-02-19 ENCOUNTER — Other Ambulatory Visit: Payer: Self-pay

## 2023-02-19 ENCOUNTER — Telehealth: Payer: Self-pay | Admitting: *Deleted

## 2023-02-19 ENCOUNTER — Other Ambulatory Visit: Payer: Self-pay | Admitting: *Deleted

## 2023-02-19 ENCOUNTER — Other Ambulatory Visit (HOSPITAL_COMMUNITY): Payer: Self-pay

## 2023-02-19 DIAGNOSIS — C61 Malignant neoplasm of prostate: Secondary | ICD-10-CM

## 2023-02-19 MED ORDER — ABIRATERONE ACETATE 250 MG PO TABS
ORAL_TABLET | Freq: Every day | ORAL | 0 refills | Status: DC
  Filled 2023-02-19: qty 120, 30d supply, fill #0
  Filled 2023-02-19: qty 120, fill #0
  Filled 2023-02-19: qty 120, 30d supply, fill #0

## 2023-02-19 NOTE — Telephone Encounter (Signed)
TCT patient regarding his refill on Zytiga. Spoke with him and advised that a refill has been sent in and will be mailed to him as usual. He voiced understanding.

## 2023-02-26 ENCOUNTER — Other Ambulatory Visit (HOSPITAL_COMMUNITY): Payer: Self-pay

## 2023-03-11 ENCOUNTER — Other Ambulatory Visit (HOSPITAL_COMMUNITY): Payer: Self-pay

## 2023-03-17 ENCOUNTER — Other Ambulatory Visit (HOSPITAL_COMMUNITY): Payer: Self-pay

## 2023-03-21 ENCOUNTER — Other Ambulatory Visit: Payer: Self-pay | Admitting: Hematology and Oncology

## 2023-03-21 ENCOUNTER — Other Ambulatory Visit: Payer: Self-pay

## 2023-03-21 ENCOUNTER — Other Ambulatory Visit (HOSPITAL_COMMUNITY): Payer: Self-pay

## 2023-03-21 DIAGNOSIS — C61 Malignant neoplasm of prostate: Secondary | ICD-10-CM

## 2023-03-21 MED ORDER — ABIRATERONE ACETATE 250 MG PO TABS
ORAL_TABLET | Freq: Every day | ORAL | 0 refills | Status: DC
  Filled 2023-03-21: qty 120, 30d supply, fill #0

## 2023-03-24 ENCOUNTER — Other Ambulatory Visit (HOSPITAL_COMMUNITY): Payer: Self-pay

## 2023-04-16 ENCOUNTER — Other Ambulatory Visit: Payer: Self-pay

## 2023-04-16 ENCOUNTER — Other Ambulatory Visit: Payer: Self-pay | Admitting: Hematology and Oncology

## 2023-04-16 ENCOUNTER — Other Ambulatory Visit (HOSPITAL_COMMUNITY): Payer: Self-pay

## 2023-04-16 DIAGNOSIS — C61 Malignant neoplasm of prostate: Secondary | ICD-10-CM

## 2023-04-16 MED ORDER — ABIRATERONE ACETATE 250 MG PO TABS
ORAL_TABLET | Freq: Every day | ORAL | 0 refills | Status: DC
  Filled 2023-04-16: qty 120, 30d supply, fill #0

## 2023-05-12 ENCOUNTER — Other Ambulatory Visit (HOSPITAL_COMMUNITY): Payer: Self-pay

## 2023-05-16 ENCOUNTER — Other Ambulatory Visit: Payer: Self-pay

## 2023-05-16 ENCOUNTER — Other Ambulatory Visit: Payer: Self-pay | Admitting: Hematology and Oncology

## 2023-05-16 ENCOUNTER — Other Ambulatory Visit (HOSPITAL_COMMUNITY): Payer: Self-pay

## 2023-05-16 DIAGNOSIS — C61 Malignant neoplasm of prostate: Secondary | ICD-10-CM

## 2023-05-16 MED ORDER — ABIRATERONE ACETATE 250 MG PO TABS
ORAL_TABLET | Freq: Every day | ORAL | 0 refills | Status: DC
  Filled 2023-05-16: qty 120, 30d supply, fill #0

## 2023-05-19 ENCOUNTER — Other Ambulatory Visit: Payer: Self-pay

## 2023-05-19 ENCOUNTER — Inpatient Hospital Stay: Payer: Medicare HMO | Attending: Hematology and Oncology | Admitting: Hematology and Oncology

## 2023-05-19 ENCOUNTER — Other Ambulatory Visit (HOSPITAL_COMMUNITY): Payer: Self-pay

## 2023-05-19 ENCOUNTER — Inpatient Hospital Stay: Payer: Medicare HMO

## 2023-05-19 ENCOUNTER — Other Ambulatory Visit: Payer: Self-pay | Admitting: Hematology and Oncology

## 2023-05-19 VITALS — BP 153/89 | HR 92 | Temp 97.8°F | Resp 17 | Wt 278.3 lb

## 2023-05-19 DIAGNOSIS — R232 Flushing: Secondary | ICD-10-CM | POA: Diagnosis not present

## 2023-05-19 DIAGNOSIS — Z803 Family history of malignant neoplasm of breast: Secondary | ICD-10-CM | POA: Insufficient documentation

## 2023-05-19 DIAGNOSIS — C61 Malignant neoplasm of prostate: Secondary | ICD-10-CM

## 2023-05-19 DIAGNOSIS — Z79899 Other long term (current) drug therapy: Secondary | ICD-10-CM | POA: Diagnosis not present

## 2023-05-19 DIAGNOSIS — Z87891 Personal history of nicotine dependence: Secondary | ICD-10-CM | POA: Insufficient documentation

## 2023-05-19 DIAGNOSIS — Z5111 Encounter for antineoplastic chemotherapy: Secondary | ICD-10-CM | POA: Insufficient documentation

## 2023-05-19 DIAGNOSIS — Z832 Family history of diseases of the blood and blood-forming organs and certain disorders involving the immune mechanism: Secondary | ICD-10-CM | POA: Diagnosis not present

## 2023-05-19 DIAGNOSIS — Z7952 Long term (current) use of systemic steroids: Secondary | ICD-10-CM | POA: Diagnosis not present

## 2023-05-19 LAB — CBC WITH DIFFERENTIAL (CANCER CENTER ONLY)
Abs Immature Granulocytes: 0.05 10*3/uL (ref 0.00–0.07)
Basophils Absolute: 0.1 10*3/uL (ref 0.0–0.1)
Basophils Relative: 1 %
Eosinophils Absolute: 0.4 10*3/uL (ref 0.0–0.5)
Eosinophils Relative: 5 %
HCT: 41.4 % (ref 39.0–52.0)
Hemoglobin: 13.9 g/dL (ref 13.0–17.0)
Immature Granulocytes: 1 %
Lymphocytes Relative: 35 %
Lymphs Abs: 2.9 10*3/uL (ref 0.7–4.0)
MCH: 29.4 pg (ref 26.0–34.0)
MCHC: 33.6 g/dL (ref 30.0–36.0)
MCV: 87.5 fL (ref 80.0–100.0)
Monocytes Absolute: 0.6 10*3/uL (ref 0.1–1.0)
Monocytes Relative: 8 %
Neutro Abs: 4.1 10*3/uL (ref 1.7–7.7)
Neutrophils Relative %: 50 %
Platelet Count: 209 10*3/uL (ref 150–400)
RBC: 4.73 MIL/uL (ref 4.22–5.81)
RDW: 13.3 % (ref 11.5–15.5)
WBC Count: 8.1 10*3/uL (ref 4.0–10.5)
nRBC: 0 % (ref 0.0–0.2)

## 2023-05-19 LAB — CMP (CANCER CENTER ONLY)
ALT: 17 U/L (ref 0–44)
AST: 11 U/L — ABNORMAL LOW (ref 15–41)
Albumin: 4.4 g/dL (ref 3.5–5.0)
Alkaline Phosphatase: 160 U/L — ABNORMAL HIGH (ref 38–126)
Anion gap: 9 (ref 5–15)
BUN: 17 mg/dL (ref 8–23)
CO2: 25 mmol/L (ref 22–32)
Calcium: 9.8 mg/dL (ref 8.9–10.3)
Chloride: 107 mmol/L (ref 98–111)
Creatinine: 0.97 mg/dL (ref 0.61–1.24)
GFR, Estimated: >60 mL/min (ref >60)
Glucose, Bld: 146 mg/dL — ABNORMAL HIGH (ref 70–99)
Potassium: 3.7 mmol/L (ref 3.5–5.1)
Sodium: 141 mmol/L (ref 135–145)
Total Bilirubin: 0.4 mg/dL (ref 0.3–1.2)
Total Protein: 6.9 g/dL (ref 6.5–8.1)

## 2023-05-19 MED ORDER — LEUPROLIDE ACETATE (4 MONTH) 30 MG ~~LOC~~ KIT
30.0000 mg | PACK | Freq: Once | SUBCUTANEOUS | Status: AC
  Administered 2023-05-19: 30 mg via SUBCUTANEOUS
  Filled 2023-05-19: qty 30

## 2023-05-19 NOTE — Progress Notes (Signed)
[facility] Telephone:([phone]   Fax:([phone]  PROGRESS NOTE  Patient Care Team: Josiah Lobo, MD (Inactive) as PCP - General (Internal Medicine)  Hematological/Oncological History # Castrate Sensitive Advanced Prostate Cancer 2015: firmagon 240 mg. Lost to follow up.  2019: initial PSA 2078, Gleason score was 4+5 = 10 January 2018: started Zytiga 1000 mg PO and Eligard 30 mg q 4 months 09/10/2022: last visit with Dr. Clelia Croft 01/16/2023: establish care with Dr. Leonides Schanz   Interval History:  Terry Hinton 67 y.o. male with medical history significant for castrate sensitive advanced prostate cancer who presents for a follow up visit. The patient's last visit was on 01/16/2023. In the interim since the last visit he has continued on his Zytiga and prednisone therapy.  On exam today Terry Hinton reports he is had no major changes in his health in the interim since our last visit.  He notes he is "feeling good".  He notes that he continues to take his Zytiga faithfully with steroids.  Is not causing him any side effects.  His appetite and energy are strong.  He is not having any urinary issues or new bone or back pain.  He reports he had no infectious symptoms such as runny nose, cough, or sore throat.  He does have some occasional hot flashes but all yesterday when this occurs is "cool down".  He is intentionally losing weight, and has dropped 8 pounds as her last visit going from 286 pounds down to 278 pounds.  He denies any fevers, chills, sweats, nausea, vomiting or diarrhea.  A full 10 point ROS is otherwise negative.  Overall he is willing and able to proceed with Zytiga and Eligard therapy at this time.  MEDICAL HISTORY:  Past Medical History:  Diagnosis Date   Allergy    Anemia    Arthritis    knee   Back pain    back, neck and leg problems, pt. stated   Enlarged prostate    pt. stated   Family history of breast cancer 06/21/2022   Hypertension     Leg injury    Prostate cancer (HCC)    Mets to Bone     SURGICAL HISTORY: Past Surgical History:  Procedure Laterality Date   NO PAST SURGERIES     PROSTATE BIOPSY      SOCIAL HISTORY: Social History   Socioeconomic History   Marital status: Widowed    Spouse name: Not on file   Number of children: Not on file   Years of education: Not on file   Highest education level: Not on file  Occupational History   Not on file  Tobacco Use   Smoking status: Former    Current packs/day: 0.00    Types: Cigarettes    Quit date: 05/08/2014    Years since quitting: 9.0   Smokeless tobacco: Never  Substance and Sexual Activity   Alcohol use: Yes    Comment: beer occasionally    Drug use: No   Sexual activity: Not Currently  Other Topics Concern   Not on file  Social History Narrative   Not on file   Social Determinants of Health   Financial Resource Strain: Not on file  Food Insecurity: Not on file  Transportation Needs: Not on file  Physical Activity: Not on file  Stress: Not on file  Social Connections: Not on file  Intimate Partner Violence: Not on file    FAMILY HISTORY: Family History  Problem Relation  Age of Onset   Breast cancer Mother        d. 45   Sickle cell anemia Sister        d. 40   Colon cancer Neg Hx    Colon polyps Neg Hx    Esophageal cancer Neg Hx    Rectal cancer Neg Hx    Stomach cancer Neg Hx     ALLERGIES:  has No Known Allergies.  MEDICATIONS:  Current Outpatient Medications  Medication Sig Dispense Refill   abiraterone acetate (ZYTIGA) 250 MG tablet TAKE 4 TABLETS (1,000 MG TOTAL) BY MOUTH DAILY. TAKE ON AN EMPTY STOMACH 1 HOUR BEFORE OR 2 HOURS AFTER A MEAL 120 tablet 0   blood glucose meter kit and supplies Dispense based on patient and insurance preference. Use up to four times daily as directed. (FOR ICD-10 E10.9, E11.9). 1 each 0   cholecalciferol (VITAMIN D) 1000 units tablet Take 1,000 Units by mouth daily.     cyclobenzaprine  (FLEXERIL) 5 MG tablet Take 5 mg by mouth 3 (three) times daily as needed for muscle spasms.     finasteride (PROSCAR) 5 MG tablet Take 5 mg by mouth daily.     insulin detemir (LEVEMIR) 100 UNIT/ML injection Inject 0.3 mLs (30 Units total) into the skin 2 (two) times daily. 10 mL 11   insulin regular (NOVOLIN R) 100 units/mL injection For glucose 121 to 150 use two units, for 151 to 200 use four units, for 201 to 250 use six units, for 251 to 300 use ten units and for 301 to 350 use twelve units, for 351 or greater use fourteen units. 10 mL 3   INSULIN SYRINGE .5CC/29G 29G X 1/2" 0.5 ML MISC 0.5 mLs by Does not apply route 5 (five) times daily. 200 each 5   latanoprost (XALATAN) 0.005 % ophthalmic solution Place 1 drop into both eyes at bedtime.     lisinopril-hydrochlorothiazide (PRINZIDE,ZESTORETIC) 10-12.5 MG tablet Take 1 tablet by mouth daily.     OVER THE COUNTER MEDICATION Take 1 tablet by mouth daily.     potassium chloride SA (K-DUR,KLOR-CON) 20 MEQ tablet Take 1 tablet (20 mEq total) by mouth daily. 30 tablet 0   predniSONE (DELTASONE) 5 MG tablet Take 1 tablet (5 mg total) by mouth daily with breakfast. 90 tablet 3   tamsulosin (FLOMAX) 0.4 MG CAPS capsule Take 0.4 mg by mouth daily.   3   traMADol (ULTRAM) 50 MG tablet Take 50 mg by mouth every 6 (six) hours as needed (Pain).     No current facility-administered medications for this visit.    REVIEW OF SYSTEMS:   Constitutional: ( - ) fevers, ( - )  chills , ( - ) night sweats Eyes: ( - ) blurriness of vision, ( - ) double vision, ( - ) watery eyes Ears, nose, mouth, throat, and face: ( - ) mucositis, ( - ) sore throat Respiratory: ( - ) cough, ( - ) dyspnea, ( - ) wheezes Cardiovascular: ( - ) palpitation, ( - ) chest discomfort, ( - ) lower extremity swelling Gastrointestinal:  ( - ) nausea, ( - ) heartburn, ( - ) change in bowel habits Skin: ( - ) abnormal skin rashes Lymphatics: ( - ) new lymphadenopathy, ( - ) easy  bruising Neurological: ( - ) numbness, ( - ) tingling, ( - ) new weaknesses Behavioral/Psych: ( - ) mood change, ( - ) new changes  All other systems were reviewed with  the patient and are negative.  PHYSICAL EXAMINATION: ECOG PERFORMANCE STATUS: 0 - Asymptomatic  Vitals:   05/19/23 1059  BP: (!) 153/89  Pulse: 92  Resp: 17  Temp: 97.8 F (36.6 C)  SpO2: 99%    Filed Weights   05/19/23 1059  Weight: 278 lb 4.8 oz (126.2 kg)     GENERAL: Well-appearing elderly male, alert, no distress and comfortable SKIN: skin color, texture, turgor are normal, no rashes or significant lesions EYES: conjunctiva are pink and non-injected, sclera clear LUNGS: clear to auscultation and percussion with normal breathing effort HEART: regular rate & rhythm and no murmurs and no lower extremity edema Musculoskeletal: no cyanosis of digits and no clubbing  PSYCH: alert & oriented x 3, fluent speech NEURO: no focal motor/sensory deficits  LABORATORY DATA:  I have reviewed the data as listed    Latest Ref Rng & Units 05/19/2023   10:32 AM 01/16/2023   11:51 AM 09/10/2022   11:04 AM  CBC  WBC 4.0 - 10.5 K/uL 8.1  8.9  9.0   Hemoglobin 13.0 - 17.0 g/dL 64.4  03.4  74.2   Hematocrit 39.0 - 52.0 % 41.4  40.7  41.5   Platelets 150 - 400 K/uL 209  242  246        Latest Ref Rng & Units 05/19/2023   10:32 AM 01/16/2023   11:51 AM 09/10/2022   11:04 AM  CMP  Glucose 70 - 99 mg/dL 595  638  756   BUN 8 - 23 mg/dL 17  19  16    Creatinine 0.61 - 1.24 mg/dL 4.33  2.95  1.88   Sodium 135 - 145 mmol/L 141  141  140   Potassium 3.5 - 5.1 mmol/L 3.7  3.9  3.9   Chloride 98 - 111 mmol/L 107  105  104   CO2 22 - 32 mmol/L 25  27  27    Calcium 8.9 - 10.3 mg/dL 9.8  41.6  60.6   Total Protein 6.5 - 8.1 g/dL 6.9  7.0  7.0   Total Bilirubin 0.3 - 1.2 mg/dL 0.4  0.3  0.4   Alkaline Phos 38 - 126 U/L 160  144  138   AST 15 - 41 U/L 11  11  12    ALT 0 - 44 U/L 17  17  22     RADIOGRAPHIC STUDIES: No results  found.  ASSESSMENT & PLAN Terry Hinton 67 y.o. male with medical history significant for castrate sensitive advanced prostate cancer who presents for a follow up visit.   # Castrate Sensitive Advanced Prostate Cancer -- Continue Eligard 30 mg every 4 months indefinitely. -- Continue Zytiga 1000 mg p.o. daily with prednisone 5 mg -- Labs today show white blood cell count 8.1, Hgb 13.9, MCV 87.5, Plt 209 --PSA <0.1 on 01/16/2023 -- Return to clinic in 4 months time for continued monitoring on Zytiga and continued Eligard shots.  No orders of the defined types were placed in this encounter.   All questions were answered. The patient knows to call the clinic with any problems, questions or concerns.  A total of more than 30 minutes were spent on this encounter with face-to-face time and non-face-to-face time, including preparing to see the patient, ordering tests and/or medications, counseling the patient and coordination of care as outlined above.   Terry Barns, MD Department of Hematology/Oncology [facility] at [facility] Phone: [phone] Pager: [phone] Email: [email or web address]  05/19/2023 1:29 PM

## 2023-05-19 NOTE — Patient Instructions (Signed)
Leuprolide Suspension for Injection (Prostate Cancer) What is this medication? LEUPROLIDE (loo PROE lide) reduces the symptoms of prostate cancer. It works by decreasing levels of the hormone testosterone in the body. This prevents prostate cancer cells from spreading or growing. This medicine may be used for other purposes; ask your health care provider or pharmacist if you have questions. COMMON BRAND NAME(S): Eligard, Lupron Depot, Lupron Depot-Ped, Lutrate Depot, Viadur What should I tell my care team before I take this medication? They need to know if you have any of these conditions: Diabetes Heart disease Heart failure High or low levels of electrolytes, such as magnesium, potassium, or sodium in your blood Irregular heartbeat or rhythm Seizures An unusual or allergic reaction to leuprolide, other medications, foods, dyes, or preservatives Pregnant or trying to get pregnant Breast-feeding How should I use this medication? This medication is injected under the skin or into a muscle. It is given by your care team in a hospital or clinic setting. Talk to your care team about the use of this medication in children. Special care may be needed. Overdosage: If you think you have taken too much of this medicine contact a poison control center or emergency room at once. NOTE: This medicine is only for you. Do not share this medicine with others. What if I miss a dose? Keep appointments for follow-up doses. It is important not to miss your dose. Call your care team if you are unable to keep an appointment. What may interact with this medication? Do not take this medication with any of the following: Cisapride Dronedarone Ketoconazole Levoketoconazole Pimozide Thioridazine This medication may also interact with the following: Other medications that cause heart rhythm changes This list may not describe all possible interactions. Give your health care provider a list of all the medicines,  herbs, non-prescription drugs, or dietary supplements you use. Also tell them if you smoke, drink alcohol, or use illegal drugs. Some items may interact with your medicine. What should I watch for while using this medication? Visit your care team for regular checks on your progress. Tell your care team if your symptoms do not start to get better or if they get worse. This medication may increase blood sugar. The risk may be higher in patients who already have diabetes. Ask your care team what you can do to lower the risk of diabetes while taking this medication. This medication may cause infertility. Talk to your care team if you are concerned about your fertility. Heart attacks and strokes have been reported with the use of this medication. Get emergency help if you develop signs or symptoms of a heart attack or stroke. Talk to your care team about the risks and benefits of this medication. What side effects may I notice from receiving this medication? Side effects that you should report to your care team as soon as possible: Allergic reactions--skin rash, itching, hives, swelling of the face, lips, tongue, or throat Heart attack--pain or tightness in the chest, shoulders, arms, or jaw, nausea, shortness of breath, cold or clammy skin, feeling faint or lightheaded Heart rhythm changes--fast or irregular heartbeat, dizziness, feeling faint or lightheaded, chest pain, trouble breathing High blood sugar (hyperglycemia)--increased thirst or amount of urine, unusual weakness or fatigue, blurry vision Mood swings, irritability, hostility Seizures Stroke--sudden numbness or weakness of the face, arm, or leg, trouble speaking, confusion, trouble walking, loss of balance or coordination, dizziness, severe headache, change in vision Thoughts of suicide or self-harm, worsening mood, feelings of depression Side   effects that usually do not require medical attention (report to your care team if they continue or  are bothersome): Bone pain Change in sex drive or performance General discomfort and fatigue Hot flashes Muscle pain Pain, redness, or irritation at injection site Swelling of the ankles, hands, or feet This list may not describe all possible side effects. Call your doctor for medical advice about side effects. You may report side effects to FDA at 1-800-FDA-1088. Where should I keep my medication? This medication is given in a hospital or clinic. It will not be stored at home. NOTE: This sheet is a summary. It may not cover all possible information. If you have questions about this medicine, talk to your doctor, pharmacist, or health care provider.  2024 Elsevier/Gold Standard (2022-01-02 00:00:00)  

## 2023-05-20 LAB — TESTOSTERONE: Testosterone: <3 ng/dL — ABNORMAL LOW (ref 264–916)

## 2023-05-20 LAB — PROSTATE-SPECIFIC AG, SERUM (LABCORP): Prostate Specific Ag, Serum: <0.1 ng/mL (ref 0.0–4.0)

## 2023-05-26 ENCOUNTER — Telehealth: Payer: Self-pay | Admitting: *Deleted

## 2023-05-26 NOTE — Telephone Encounter (Signed)
-----   Message from Ulysees Barns IV sent at 05/23/2023  2:30 PM EDT ----- Please let Mr. Laski know that his PSA remains undetectable. We will see him back in Nov 2024 as scheduled. ----- Message ----- From: Leory Plowman, Lab In [place] Sent: 05/19/2023  10:44 AM EDT To: Jaci Standard, MD

## 2023-05-26 NOTE — Telephone Encounter (Signed)
TCT patient regarding recent lab results. Spoke with patient and advised that his PSA remains undetectable. We will see him back in Nov 2024 as scheduled. Pt voiced understanding. He is aware of his appt in [place]

## 2023-06-10 ENCOUNTER — Other Ambulatory Visit (HOSPITAL_COMMUNITY): Payer: Self-pay

## 2023-06-13 ENCOUNTER — Other Ambulatory Visit (HOSPITAL_COMMUNITY): Payer: Self-pay

## 2023-06-13 ENCOUNTER — Other Ambulatory Visit: Payer: Self-pay | Admitting: Hematology and Oncology

## 2023-06-13 ENCOUNTER — Other Ambulatory Visit: Payer: Self-pay

## 2023-06-13 DIAGNOSIS — C61 Malignant neoplasm of prostate: Secondary | ICD-10-CM

## 2023-06-13 MED ORDER — ABIRATERONE ACETATE 250 MG PO TABS
ORAL_TABLET | Freq: Every day | ORAL | 0 refills | Status: DC
Start: 2023-06-13 — End: 2023-07-14
  Filled 2023-06-13: qty 120, 30d supply, fill #0

## 2023-06-16 ENCOUNTER — Other Ambulatory Visit (HOSPITAL_COMMUNITY): Payer: Self-pay

## 2023-07-09 ENCOUNTER — Other Ambulatory Visit (HOSPITAL_COMMUNITY): Payer: Self-pay

## 2023-07-11 ENCOUNTER — Other Ambulatory Visit (HOSPITAL_COMMUNITY): Payer: Self-pay

## 2023-07-14 ENCOUNTER — Other Ambulatory Visit (HOSPITAL_COMMUNITY): Payer: Self-pay

## 2023-07-14 ENCOUNTER — Other Ambulatory Visit: Payer: Self-pay | Admitting: Hematology and Oncology

## 2023-07-14 ENCOUNTER — Other Ambulatory Visit: Payer: Self-pay

## 2023-07-14 DIAGNOSIS — C61 Malignant neoplasm of prostate: Secondary | ICD-10-CM

## 2023-07-14 MED ORDER — ABIRATERONE ACETATE 250 MG PO TABS
ORAL_TABLET | Freq: Every day | ORAL | 0 refills | Status: DC
Start: 2023-07-14 — End: 2023-08-13
  Filled 2023-07-14: qty 120, 30d supply, fill #0

## 2023-08-13 ENCOUNTER — Other Ambulatory Visit: Payer: Self-pay

## 2023-08-13 ENCOUNTER — Other Ambulatory Visit: Payer: Self-pay | Admitting: Hematology and Oncology

## 2023-08-13 DIAGNOSIS — C61 Malignant neoplasm of prostate: Secondary | ICD-10-CM

## 2023-08-13 MED ORDER — ABIRATERONE ACETATE 250 MG PO TABS
ORAL_TABLET | Freq: Every day | ORAL | 0 refills | Status: DC
Start: 2023-08-13 — End: 2023-09-04
  Filled 2023-08-13: qty 120, 30d supply, fill #0

## 2023-08-13 NOTE — Progress Notes (Signed)
Refill received

## 2023-08-13 NOTE — Progress Notes (Signed)
Specialty Pharmacy Refill Coordination Note  Muaad Kauffmann is a 67 y.o. male contacted today regarding refills of specialty medication(s) Abiraterone Acetate   Patient requested Delivery   Delivery date: 08/19/23   Verified address: [address] [place], [place] [address]   Medication will be filled on 08/18/2023.

## 2023-09-04 ENCOUNTER — Other Ambulatory Visit: Payer: Self-pay | Admitting: Hematology and Oncology

## 2023-09-04 ENCOUNTER — Other Ambulatory Visit: Payer: Self-pay

## 2023-09-04 DIAGNOSIS — C61 Malignant neoplasm of prostate: Secondary | ICD-10-CM

## 2023-09-04 NOTE — Progress Notes (Signed)
Specialty Pharmacy Refill Coordination Note  Terry Hinton is a 67 y.o. male contacted today regarding refills of specialty medication(s) Abiraterone Acetate   Patient requested Delivery   Delivery date: 09/15/23   Verified address: [address] [place], [place] [address]   Medication will be filled on 09/12/23.  Refill request pending.

## 2023-09-05 ENCOUNTER — Other Ambulatory Visit: Payer: Self-pay

## 2023-09-05 MED ORDER — ABIRATERONE ACETATE 250 MG PO TABS
ORAL_TABLET | Freq: Every day | ORAL | 0 refills | Status: DC
  Filled 2023-09-05: qty 120, 30d supply, fill #0

## 2023-09-05 NOTE — Progress Notes (Signed)
Refill received

## 2023-09-11 ENCOUNTER — Other Ambulatory Visit (HOSPITAL_COMMUNITY): Payer: Self-pay

## 2023-09-17 ENCOUNTER — Other Ambulatory Visit: Payer: Self-pay | Admitting: Nurse Practitioner

## 2023-09-17 DIAGNOSIS — C61 Malignant neoplasm of prostate: Secondary | ICD-10-CM

## 2023-09-18 ENCOUNTER — Inpatient Hospital Stay: Payer: Medicare HMO | Attending: Hematology and Oncology | Admitting: Hematology and Oncology

## 2023-09-18 ENCOUNTER — Inpatient Hospital Stay: Payer: Medicare HMO

## 2023-09-18 ENCOUNTER — Other Ambulatory Visit: Payer: Self-pay

## 2023-09-18 VITALS — BP 117/88 | HR 128 | Temp 97.3°F | Resp 17 | Wt 285.1 lb

## 2023-09-18 DIAGNOSIS — Z7952 Long term (current) use of systemic steroids: Secondary | ICD-10-CM | POA: Diagnosis not present

## 2023-09-18 DIAGNOSIS — Z87891 Personal history of nicotine dependence: Secondary | ICD-10-CM | POA: Diagnosis not present

## 2023-09-18 DIAGNOSIS — Z832 Family history of diseases of the blood and blood-forming organs and certain disorders involving the immune mechanism: Secondary | ICD-10-CM | POA: Insufficient documentation

## 2023-09-18 DIAGNOSIS — Z79899 Other long term (current) drug therapy: Secondary | ICD-10-CM | POA: Diagnosis not present

## 2023-09-18 DIAGNOSIS — C61 Malignant neoplasm of prostate: Secondary | ICD-10-CM

## 2023-09-18 DIAGNOSIS — Z803 Family history of malignant neoplasm of breast: Secondary | ICD-10-CM | POA: Diagnosis not present

## 2023-09-18 DIAGNOSIS — Z5111 Encounter for antineoplastic chemotherapy: Secondary | ICD-10-CM | POA: Diagnosis present

## 2023-09-18 LAB — CBC WITH DIFFERENTIAL (CANCER CENTER ONLY)
Abs Immature Granulocytes: 0.1 10*3/uL — ABNORMAL HIGH (ref 0.00–0.07)
Basophils Absolute: 0.1 10*3/uL (ref 0.0–0.1)
Basophils Relative: 1 %
Eosinophils Absolute: 0.4 10*3/uL (ref 0.0–0.5)
Eosinophils Relative: 4 %
HCT: 41 % (ref 39.0–52.0)
Hemoglobin: 14 g/dL (ref 13.0–17.0)
Immature Granulocytes: 1 %
Lymphocytes Relative: 36 %
Lymphs Abs: 3.2 10*3/uL (ref 0.7–4.0)
MCH: 29.5 pg (ref 26.0–34.0)
MCHC: 34.1 g/dL (ref 30.0–36.0)
MCV: 86.5 fL (ref 80.0–100.0)
Monocytes Absolute: 0.6 10*3/uL (ref 0.1–1.0)
Monocytes Relative: 7 %
Neutro Abs: 4.7 10*3/uL (ref 1.7–7.7)
Neutrophils Relative %: 51 %
Platelet Count: 274 10*3/uL (ref 150–400)
RBC: 4.74 MIL/uL (ref 4.22–5.81)
RDW: 12.6 % (ref 11.5–15.5)
WBC Count: 9 10*3/uL (ref 4.0–10.5)
nRBC: 0 % (ref 0.0–0.2)

## 2023-09-18 LAB — CMP (CANCER CENTER ONLY)
ALT: 20 U/L (ref 0–44)
AST: 12 U/L — ABNORMAL LOW (ref 15–41)
Albumin: 4.4 g/dL (ref 3.5–5.0)
Alkaline Phosphatase: 144 U/L — ABNORMAL HIGH (ref 38–126)
Anion gap: 9 (ref 5–15)
BUN: 15 mg/dL (ref 8–23)
CO2: 28 mmol/L (ref 22–32)
Calcium: 10 mg/dL (ref 8.9–10.3)
Chloride: 104 mmol/L (ref 98–111)
Creatinine: 1.01 mg/dL (ref 0.61–1.24)
GFR, Estimated: >60 mL/min (ref >60)
Glucose, Bld: 124 mg/dL — ABNORMAL HIGH (ref 70–99)
Potassium: 4.1 mmol/L (ref 3.5–5.1)
Sodium: 141 mmol/L (ref 135–145)
Total Bilirubin: 0.3 mg/dL (ref <1.2)
Total Protein: 6.9 g/dL (ref 6.5–8.1)

## 2023-09-18 MED ORDER — LEUPROLIDE ACETATE (4 MONTH) 30 MG ~~LOC~~ KIT
30.0000 mg | PACK | Freq: Once | SUBCUTANEOUS | Status: AC
Start: 2023-09-18 — End: 2023-09-18
  Administered 2023-09-18: 30 mg via SUBCUTANEOUS
  Filled 2023-09-18: qty 30

## 2023-09-18 NOTE — Progress Notes (Signed)
[facility] Telephone:(336) [phone]   Fax:(336) [phone]  PROGRESS NOTE  Patient Care Team: Josiah Lobo, MD (Inactive) as PCP - General (Internal Medicine)  Hematological/Oncological History # Castrate Sensitive Advanced Prostate Cancer 2015: firmagon 240 mg. Lost to follow up.  2019: initial PSA 2078, Gleason score was 4+5 = 10 January 2018: started Zytiga 1000 mg PO and Eligard 30 mg q 4 months 09/10/2022: last visit with Dr. Clelia Croft 01/16/2023: establish care with Dr. Leonides Schanz   Interval History:  Terry Hinton 67 y.o. male with medical history significant for castrate sensitive advanced prostate cancer who presents for a follow up visit. The patient's last visit was on 05/19/2023. In the interim since the last visit he has continued on his Zytiga and prednisone therapy.  On exam today Terry Hinton reports he has been well overall in the interim since his last visit.  He reports that he is taking his IT get pills faithfully every day with steroid.  He reports he is had no changes in his health.  He is trying to do his best to "eat the right foods".  He notes that he does not have any issues with hot flashes or sweats.  He reports that his overall his energy levels are good and he has been able to exercise and work his muscles.  He reports that he has not had any issues with urination or having to wake up at night.  He reports he feels quite good.  He denies any fevers, chills, sweats, nausea, vomiting or diarrhea.  He denies any runny nose, sore throat, or cough.  A full 10 point ROS is otherwise negative.  Overall he is willing and able to proceed with Zytiga and Eligard therapy at this time.  MEDICAL HISTORY:  Past Medical History:  Diagnosis Date   Allergy    Anemia    Arthritis    knee   Back pain    back, neck and leg problems, pt. stated   Enlarged prostate    pt. stated   Family history of breast cancer 06/21/2022   Hypertension    Leg injury     Prostate cancer (HCC)    Mets to Bone     SURGICAL HISTORY: Past Surgical History:  Procedure Laterality Date   NO PAST SURGERIES     PROSTATE BIOPSY      SOCIAL HISTORY: Social History   Socioeconomic History   Marital status: Widowed    Spouse name: Not on file   Number of children: Not on file   Years of education: Not on file   Highest education level: Not on file  Occupational History   Not on file  Tobacco Use   Smoking status: Former    Current packs/day: 0.00    Types: Cigarettes    Quit date: 05/08/2014    Years since quitting: 9.3   Smokeless tobacco: Never  Substance and Sexual Activity   Alcohol use: Yes    Comment: beer occasionally    Drug use: No   Sexual activity: Not Currently  Other Topics Concern   Not on file  Social History Narrative   Not on file   Social Determinants of Health   Financial Resource Strain: Not on file  Food Insecurity: Not on file  Transportation Needs: Not on file  Physical Activity: Not on file  Stress: Not on file  Social Connections: Not on file  Intimate Partner Violence: Not on file    FAMILY HISTORY: Family  History  Problem Relation Age of Onset   Breast cancer Mother        d. 45   Sickle cell anemia Sister        d. 47   Colon cancer Neg Hx    Colon polyps Neg Hx    Esophageal cancer Neg Hx    Rectal cancer Neg Hx    Stomach cancer Neg Hx     ALLERGIES:  has No Known Allergies.  MEDICATIONS:  Current Outpatient Medications  Medication Sig Dispense Refill   abiraterone acetate (ZYTIGA) 250 MG tablet TAKE 4 TABLETS (1,000 MG TOTAL) BY MOUTH DAILY. TAKE ON AN EMPTY STOMACH 1 HOUR BEFORE OR 2 HOURS AFTER A MEAL 120 tablet 0   blood glucose meter kit and supplies Dispense based on patient and insurance preference. Use up to four times daily as directed. (FOR ICD-10 E10.9, E11.9). 1 each 0   cholecalciferol (VITAMIN D) 1000 units tablet Take 1,000 Units by mouth daily.     cyclobenzaprine (FLEXERIL) 5 MG  tablet Take 5 mg by mouth 3 (three) times daily as needed for muscle spasms.     finasteride (PROSCAR) 5 MG tablet Take 5 mg by mouth daily.     insulin detemir (LEVEMIR) 100 UNIT/ML injection Inject 0.3 mLs (30 Units total) into the skin 2 (two) times daily. 10 mL 11   insulin regular (NOVOLIN R) 100 units/mL injection For glucose 121 to 150 use two units, for 151 to 200 use four units, for 201 to 250 use six units, for 251 to 300 use ten units and for 301 to 350 use twelve units, for 351 or greater use fourteen units. 10 mL 3   INSULIN SYRINGE .5CC/29G 29G X 1/2" 0.5 ML MISC 0.5 mLs by Does not apply route 5 (five) times daily. 200 each 5   latanoprost (XALATAN) 0.005 % ophthalmic solution Place 1 drop into both eyes at bedtime.     lisinopril-hydrochlorothiazide (PRINZIDE,ZESTORETIC) 10-12.5 MG tablet Take 1 tablet by mouth daily.     OVER THE COUNTER MEDICATION Take 1 tablet by mouth daily.     potassium chloride SA (K-DUR,KLOR-CON) 20 MEQ tablet Take 1 tablet (20 mEq total) by mouth daily. 30 tablet 0   predniSONE (DELTASONE) 5 MG tablet Take 1 tablet (5 mg total) by mouth daily with breakfast. 90 tablet 3   tamsulosin (FLOMAX) 0.4 MG CAPS capsule Take 0.4 mg by mouth daily.   3   traMADol (ULTRAM) 50 MG tablet Take 50 mg by mouth every 6 (six) hours as needed (Pain).     No current facility-administered medications for this visit.    REVIEW OF SYSTEMS:   Constitutional: ( - ) fevers, ( - )  chills , ( - ) night sweats Eyes: ( - ) blurriness of vision, ( - ) double vision, ( - ) watery eyes Ears, nose, mouth, throat, and face: ( - ) mucositis, ( - ) sore throat Respiratory: ( - ) cough, ( - ) dyspnea, ( - ) wheezes Cardiovascular: ( - ) palpitation, ( - ) chest discomfort, ( - ) lower extremity swelling Gastrointestinal:  ( - ) nausea, ( - ) heartburn, ( - ) change in bowel habits Skin: ( - ) abnormal skin rashes Lymphatics: ( - ) new lymphadenopathy, ( - ) easy bruising Neurological: (  - ) numbness, ( - ) tingling, ( - ) new weaknesses Behavioral/Psych: ( - ) mood change, ( - ) new changes  All other  systems were reviewed with the patient and are negative.  PHYSICAL EXAMINATION: ECOG PERFORMANCE STATUS: 0 - Asymptomatic  Vitals:   09/18/23 1103  BP: 117/88  Pulse: (!) 128  Resp: 17  Temp: (!) 97.3 F (36.3 C)  SpO2: 96%     Filed Weights   09/18/23 1103  Weight: 285 lb 1.6 oz (129.3 kg)      GENERAL: Well-appearing elderly male, alert, no distress and comfortable SKIN: skin color, texture, turgor are normal, no rashes or significant lesions EYES: conjunctiva are pink and non-injected, sclera clear LUNGS: clear to auscultation and percussion with normal breathing effort HEART: regular rate & rhythm and no murmurs and no lower extremity edema Musculoskeletal: no cyanosis of digits and no clubbing  PSYCH: alert & oriented x 3, fluent speech NEURO: no focal motor/sensory deficits  LABORATORY DATA:  I have reviewed the data as listed    Latest Ref Rng & Units 09/18/2023   10:43 AM 05/19/2023   10:32 AM 01/16/2023   11:51 AM  CBC  WBC 4.0 - 10.5 K/uL 9.0  8.1  8.9   Hemoglobin 13.0 - 17.0 g/dL 81.1  91.4  78.2   Hematocrit 39.0 - 52.0 % 41.0  41.4  40.7   Platelets 150 - 400 K/uL 274  209  242        Latest Ref Rng & Units 09/18/2023   10:43 AM 05/19/2023   10:32 AM 01/16/2023   11:51 AM  CMP  Glucose 70 - 99 mg/dL 956  213  086   BUN 8 - 23 mg/dL 15  17  19    Creatinine 0.61 - 1.24 mg/dL 5.78  4.69  6.29   Sodium 135 - 145 mmol/L 141  141  141   Potassium 3.5 - 5.1 mmol/L 4.1  3.7  3.9   Chloride 98 - 111 mmol/L 104  107  105   CO2 22 - 32 mmol/L 28  25  27    Calcium 8.9 - 10.3 mg/dL 52.8  9.8  41.3   Total Protein 6.5 - 8.1 g/dL 6.9  6.9  7.0   Total Bilirubin <1.2 mg/dL 0.3  0.4  0.3   Alkaline Phos 38 - 126 U/L 144  160  144   AST 15 - 41 U/L 12  11  11    ALT 0 - 44 U/L 20  17  17     RADIOGRAPHIC STUDIES: No results  found.  ASSESSMENT & PLAN Terry Hinton 67 y.o. male with medical history significant for castrate sensitive advanced prostate cancer who presents for a follow up visit.   # Castrate Sensitive Advanced Prostate Cancer -- Continue Eligard 30 mg every 4 months indefinitely. -- Continue Zytiga 1000 mg p.o. daily with prednisone 5 mg -- Labs today show white blood cell count, hemoglobin 14.0, MCV 86.5, platelets 274 --PSA <0.1 with testosterone less than 3 on 05/19/2023 -- Return to clinic in 4 months time for continued monitoring on Zytiga and continued Eligard shots.  No orders of the defined types were placed in this encounter.   All questions were answered. The patient knows to call the clinic with any problems, questions or concerns.  A total of more than 30 minutes were spent on this encounter with face-to-face time and non-face-to-face time, including preparing to see the patient, ordering tests and/or medications, counseling the patient and coordination of care as outlined above.   Ulysees Barns, MD Department of Hematology/Oncology [facility] at [facility] Phone:  [phone] Pager: [phone] Email: [email or web address]  09/18/2023 5:17 PM

## 2023-09-19 LAB — TESTOSTERONE: Testosterone: <3 ng/dL — ABNORMAL LOW (ref 264–916)

## 2023-09-19 LAB — PROSTATE-SPECIFIC AG, SERUM (LABCORP): Prostate Specific Ag, Serum: <0.1 ng/mL (ref 0.0–4.0)

## 2023-10-06 ENCOUNTER — Other Ambulatory Visit (HOSPITAL_COMMUNITY): Payer: Self-pay

## 2023-10-06 ENCOUNTER — Other Ambulatory Visit: Payer: Self-pay | Admitting: Nurse Practitioner

## 2023-10-06 DIAGNOSIS — C61 Malignant neoplasm of prostate: Secondary | ICD-10-CM

## 2023-10-06 MED ORDER — ABIRATERONE ACETATE 250 MG PO TABS
ORAL_TABLET | Freq: Every day | ORAL | 0 refills | Status: DC
  Filled 2023-10-07: qty 120, 30d supply, fill #0

## 2023-10-06 NOTE — Progress Notes (Signed)
Specialty Pharmacy Ongoing Clinical Assessment Note  Terry Hinton is a 67 y.o. male who is being followed by the specialty pharmacy service for RxSp Oncology   Patient's specialty medication(s) reviewed today: Abiraterone Acetate   Missed doses in the last 4 weeks: 0   Patient/Caregiver did not have any additional questions or concerns.   Therapeutic benefit summary: Patient is achieving benefit   Adverse events/side effects summary: No adverse events/side effects   Patient's therapy is appropriate to: Continue    Goals Addressed             This Visit's Progress    Slow Disease Progression   On track    Patient is on track. Patient will maintain adherence and adhere to provider and/or lab appointments.  PSA remains <0.1 ng/mL.          Follow up:  6 months  Servando Snare Specialty Pharmacist

## 2023-10-06 NOTE — Progress Notes (Signed)
Specialty Pharmacy Refill Coordination Note  Terry Hinton is a 67 y.o. male contacted today regarding refills of specialty medication(s) Abiraterone Acetate   Patient requested Delivery   Delivery date: 10/10/23   Verified address: [address] [place] [address]   Medication will be filled on 10/09/23. *Pending refill request*

## 2023-10-07 ENCOUNTER — Other Ambulatory Visit: Payer: Self-pay

## 2023-10-09 ENCOUNTER — Other Ambulatory Visit: Payer: Self-pay

## 2023-10-30 ENCOUNTER — Other Ambulatory Visit (HOSPITAL_COMMUNITY): Payer: Self-pay

## 2023-10-30 ENCOUNTER — Other Ambulatory Visit: Payer: Self-pay

## 2023-10-30 ENCOUNTER — Other Ambulatory Visit: Payer: Self-pay | Admitting: Hematology and Oncology

## 2023-10-30 DIAGNOSIS — C61 Malignant neoplasm of prostate: Secondary | ICD-10-CM

## 2023-10-30 MED ORDER — ABIRATERONE ACETATE 250 MG PO TABS
ORAL_TABLET | Freq: Every day | ORAL | 0 refills | Status: DC
  Filled 2023-10-30: qty 120, 30d supply, fill #0

## 2023-10-30 NOTE — Progress Notes (Signed)
Specialty Pharmacy Refill Coordination Note  Terry Hinton is a 67 y.o. male contacted today regarding refills of specialty medication(s) Abiraterone Acetate (ZYTIGA)   Patient requested Delivery   Delivery date: 11/06/23   Verified address: [address] [place] [address]   Medication will be filled on 11/04/23.

## 2023-11-04 ENCOUNTER — Other Ambulatory Visit: Payer: Self-pay

## 2023-11-25 ENCOUNTER — Other Ambulatory Visit: Payer: Self-pay

## 2023-11-25 ENCOUNTER — Other Ambulatory Visit: Payer: Self-pay | Admitting: Hematology and Oncology

## 2023-11-25 DIAGNOSIS — C61 Malignant neoplasm of prostate: Secondary | ICD-10-CM

## 2023-11-25 MED ORDER — ABIRATERONE ACETATE 250 MG PO TABS
ORAL_TABLET | Freq: Every day | ORAL | 0 refills | Status: DC
  Filled 2023-11-25: qty 120, 30d supply, fill #0

## 2023-11-25 NOTE — Progress Notes (Signed)
Specialty Pharmacy Refill Coordination Note  Terry Hinton is a 68 y.o. male contacted today regarding refills of specialty medication(s) Abiraterone Acetate (ZYTIGA)   Patient requested Delivery   Delivery date: 12/02/23   Verified address: [address]   [place] [address]   Medication will be filled on 12/01/23 pending a refill request.

## 2023-12-01 ENCOUNTER — Other Ambulatory Visit: Payer: Self-pay

## 2023-12-17 ENCOUNTER — Encounter: Payer: Self-pay | Admitting: Gastroenterology

## 2023-12-18 ENCOUNTER — Other Ambulatory Visit (HOSPITAL_COMMUNITY): Payer: Self-pay

## 2023-12-18 ENCOUNTER — Other Ambulatory Visit: Payer: Self-pay

## 2023-12-18 ENCOUNTER — Other Ambulatory Visit: Payer: Self-pay | Admitting: Hematology and Oncology

## 2023-12-18 DIAGNOSIS — C61 Malignant neoplasm of prostate: Secondary | ICD-10-CM

## 2023-12-18 MED ORDER — ABIRATERONE ACETATE 250 MG PO TABS
ORAL_TABLET | Freq: Every day | ORAL | 0 refills | Status: DC
  Filled 2023-12-18: qty 120, 30d supply, fill #0

## 2023-12-18 NOTE — Progress Notes (Signed)
Specialty Pharmacy Refill Coordination Note  Terry Hinton is a 68 y.o. male contacted today regarding refills of specialty medication(s) Abiraterone Acetate (ZYTIGA)   Patient requested Delivery   Delivery date: 12/29/23   Verified address: [address]   [place] [address]   Medication will be filled on 02.21.25.   This fill date is pending response to refill request from provider. Patient is aware and if they have not received fill by intended date they must follow up with pharmacy.

## 2024-01-16 ENCOUNTER — Inpatient Hospital Stay: Payer: Medicare HMO | Attending: Hematology and Oncology

## 2024-01-16 ENCOUNTER — Inpatient Hospital Stay (HOSPITAL_BASED_OUTPATIENT_CLINIC_OR_DEPARTMENT_OTHER): Payer: Medicare HMO | Admitting: Hematology and Oncology

## 2024-01-16 ENCOUNTER — Inpatient Hospital Stay: Payer: Medicare HMO

## 2024-01-16 ENCOUNTER — Other Ambulatory Visit: Payer: Self-pay | Admitting: Hematology and Oncology

## 2024-01-16 VITALS — BP 137/79 | HR 114 | Temp 97.1°F | Resp 18 | Wt 284.5 lb

## 2024-01-16 DIAGNOSIS — Z7952 Long term (current) use of systemic steroids: Secondary | ICD-10-CM | POA: Diagnosis not present

## 2024-01-16 DIAGNOSIS — Z79899 Other long term (current) drug therapy: Secondary | ICD-10-CM | POA: Diagnosis not present

## 2024-01-16 DIAGNOSIS — Z832 Family history of diseases of the blood and blood-forming organs and certain disorders involving the immune mechanism: Secondary | ICD-10-CM | POA: Diagnosis not present

## 2024-01-16 DIAGNOSIS — Z5111 Encounter for antineoplastic chemotherapy: Secondary | ICD-10-CM | POA: Insufficient documentation

## 2024-01-16 DIAGNOSIS — C61 Malignant neoplasm of prostate: Secondary | ICD-10-CM | POA: Insufficient documentation

## 2024-01-16 DIAGNOSIS — Z803 Family history of malignant neoplasm of breast: Secondary | ICD-10-CM | POA: Insufficient documentation

## 2024-01-16 DIAGNOSIS — Z87891 Personal history of nicotine dependence: Secondary | ICD-10-CM | POA: Insufficient documentation

## 2024-01-16 DIAGNOSIS — Z79620 Long term (current) use of immunosuppressive biologic: Secondary | ICD-10-CM | POA: Diagnosis not present

## 2024-01-16 LAB — CBC WITH DIFFERENTIAL (CANCER CENTER ONLY)
Abs Immature Granulocytes: 0.04 10*3/uL (ref 0.00–0.07)
Basophils Absolute: 0.1 10*3/uL (ref 0.0–0.1)
Basophils Relative: 1 %
Eosinophils Absolute: 0.4 10*3/uL (ref 0.0–0.5)
Eosinophils Relative: 5 %
HCT: 44.4 % (ref 39.0–52.0)
Hemoglobin: 14.4 g/dL (ref 13.0–17.0)
Immature Granulocytes: 1 %
Lymphocytes Relative: 36 %
Lymphs Abs: 2.9 10*3/uL (ref 0.7–4.0)
MCH: 28.1 pg (ref 26.0–34.0)
MCHC: 32.4 g/dL (ref 30.0–36.0)
MCV: 86.7 fL (ref 80.0–100.0)
Monocytes Absolute: 0.6 10*3/uL (ref 0.1–1.0)
Monocytes Relative: 7 %
Neutro Abs: 4.3 10*3/uL (ref 1.7–7.7)
Neutrophils Relative %: 50 %
Platelet Count: 226 10*3/uL (ref 150–400)
RBC: 5.12 MIL/uL (ref 4.22–5.81)
RDW: 12.8 % (ref 11.5–15.5)
WBC Count: 8.2 10*3/uL (ref 4.0–10.5)
nRBC: 0 % (ref 0.0–0.2)

## 2024-01-16 LAB — CMP (CANCER CENTER ONLY)
ALT: 16 U/L (ref 0–44)
AST: 12 U/L — ABNORMAL LOW (ref 15–41)
Albumin: 4.8 g/dL (ref 3.5–5.0)
Alkaline Phosphatase: 175 U/L — ABNORMAL HIGH (ref 38–126)
Anion gap: 8 (ref 5–15)
BUN: 14 mg/dL (ref 8–23)
CO2: 26 mmol/L (ref 22–32)
Calcium: 9.8 mg/dL (ref 8.9–10.3)
Chloride: 105 mmol/L (ref 98–111)
Creatinine: 0.89 mg/dL (ref 0.61–1.24)
GFR, Estimated: >60 mL/min (ref >60)
Glucose, Bld: 165 mg/dL — ABNORMAL HIGH (ref 70–99)
Potassium: 4.2 mmol/L (ref 3.5–5.1)
Sodium: 139 mmol/L (ref 135–145)
Total Bilirubin: 0.5 mg/dL (ref 0.0–1.2)
Total Protein: 7.3 g/dL (ref 6.5–8.1)

## 2024-01-16 MED ORDER — LEUPROLIDE ACETATE (4 MONTH) 30 MG ~~LOC~~ KIT
30.0000 mg | PACK | Freq: Once | SUBCUTANEOUS | Status: AC
  Administered 2024-01-16: 30 mg via SUBCUTANEOUS
  Filled 2024-01-16: qty 30

## 2024-01-16 NOTE — Progress Notes (Signed)
 [facility] [phone]   [phone]  PROGRESS NOTE  Patient Care Team: Josiah Lobo, MD (Inactive) as PCP - General (Internal Medicine)  Hematological/Oncological History # Castrate Sensitive Advanced Prostate Cancer 2015: firmagon 240 mg. Lost to follow up.  2019: initial PSA 2078, Gleason score was 4+5 = 10 January 2018: started Zytiga 1000 mg PO and Eligard 30 mg q 4 months 09/10/2022: last visit with Dr. Clelia Croft 01/16/2023: establish care with Dr. Leonides Schanz   Interval History:  Terry Hinton 68 y.o. male with medical history significant for castrate sensitive advanced prostate cancer who presents for a follow up visit. The patient's last visit was on 09/18/2023. In the interim since the last visit he has continued on his Zytiga and prednisone therapy.  On exam today Mr. Enyeart reports he has had no major changes in his health in interim since her last visit.  He reports he is faithfully taking his Zytiga and prednisone therapy every day.  He reports he is not having any trouble with these medications.  He denies any hot flashes, chills, sweats.  He reports his appetite is good and he is doing his best to "avoid sweets".  He notes that he is not currently having any pain anywhere.  He said no recent infectious symptoms such as runny nose, sore throat, cough.  Overall he feels well and is willing and able to proceed with Eligard and Zytiga at this time.[name] He denies any fevers, chills, sweats, nausea, vomiting or diarrhea.  He denies any runny nose, sore throat, or cough.  A full 10 point ROS is otherwise negative.   MEDICAL HISTORY:  Past Medical History:  Diagnosis Date   Allergy    Anemia    Arthritis    knee   Back pain    back, neck and leg problems, pt. stated   Enlarged prostate    pt. stated   Family history of breast cancer 06/21/2022   Hypertension    Leg injury    Prostate cancer (HCC)    Mets to Bone     SURGICAL  HISTORY: Past Surgical History:  Procedure Laterality Date   NO PAST SURGERIES     PROSTATE BIOPSY      SOCIAL HISTORY: Social History   Socioeconomic History   Marital status: Widowed    Spouse name: Not on file   Number of children: Not on file   Years of education: Not on file   Highest education level: Not on file  Occupational History   Not on file  Tobacco Use   Smoking status: Former    Current packs/day: 0.00    Types: Cigarettes    Quit date: 05/08/2014    Years since quitting: 9.7   Smokeless tobacco: Never  Substance and Sexual Activity   Alcohol use: Yes    Comment: beer occasionally    Drug use: No   Sexual activity: Not Currently  Other Topics Concern   Not on file  Social History Narrative   Not on file   Social Drivers of Health   Financial Resource Strain: Not on file  Food Insecurity: Not on file  Transportation Needs: Not on file  Physical Activity: Not on file  Stress: Not on file  Social Connections: Not on file  Intimate Partner Violence: Not on file    FAMILY HISTORY: Family History  Problem Relation Age of Onset   Breast cancer Mother        d. 42  Sickle cell anemia Sister        d. 20   Colon cancer Neg Hx    Colon polyps Neg Hx    Esophageal cancer Neg Hx    Rectal cancer Neg Hx    Stomach cancer Neg Hx     ALLERGIES:  has no known allergies.  MEDICATIONS:  Current Outpatient Medications  Medication Sig Dispense Refill   abiraterone acetate (ZYTIGA) 250 MG tablet TAKE 4 TABLETS (1,000 MG TOTAL) BY MOUTH DAILY. TAKE ON AN EMPTY STOMACH 1 HOUR BEFORE OR 2 HOURS AFTER A MEAL 120 tablet 0   blood glucose meter kit and supplies Dispense based on patient and insurance preference. Use up to four times daily as directed. (FOR ICD-10 E10.9, E11.9). 1 each 0   cholecalciferol (VITAMIN D) 1000 units tablet Take 1,000 Units by mouth daily.     cyclobenzaprine (FLEXERIL) 5 MG tablet Take 5 mg by mouth 3 (three) times daily as needed for  muscle spasms.     finasteride (PROSCAR) 5 MG tablet Take 5 mg by mouth daily.     insulin detemir (LEVEMIR) 100 UNIT/ML injection Inject 0.3 mLs (30 Units total) into the skin 2 (two) times daily. 10 mL 11   insulin regular (NOVOLIN R) 100 units/mL injection For glucose 121 to 150 use two units, for 151 to 200 use four units, for 201 to 250 use six units, for 251 to 300 use ten units and for 301 to 350 use twelve units, for 351 or greater use fourteen units. 10 mL 3   INSULIN SYRINGE .5CC/29G 29G X 1/2" 0.5 ML MISC 0.5 mLs by Does not apply route 5 (five) times daily. 200 each 5   latanoprost (XALATAN) 0.005 % ophthalmic solution Place 1 drop into both eyes at bedtime.     lisinopril-hydrochlorothiazide (PRINZIDE,ZESTORETIC) 10-12.5 MG tablet Take 1 tablet by mouth daily.     OVER THE COUNTER MEDICATION Take 1 tablet by mouth daily.     potassium chloride SA (K-DUR,KLOR-CON) 20 MEQ tablet Take 1 tablet (20 mEq total) by mouth daily. 30 tablet 0   predniSONE (DELTASONE) 5 MG tablet Take 1 tablet (5 mg total) by mouth daily with breakfast. 90 tablet 3   tamsulosin (FLOMAX) 0.4 MG CAPS capsule Take 0.4 mg by mouth daily.   3   traMADol (ULTRAM) 50 MG tablet Take 50 mg by mouth every 6 (six) hours as needed (Pain).     No current facility-administered medications for this visit.    REVIEW OF SYSTEMS:   Constitutional: ( - ) fevers, ( - )  chills , ( - ) night sweats Eyes: ( - ) blurriness of vision, ( - ) double vision, ( - ) watery eyes Ears, nose, mouth, throat, and face: ( - ) mucositis, ( - ) sore throat Respiratory: ( - ) cough, ( - ) dyspnea, ( - ) wheezes Cardiovascular: ( - ) palpitation, ( - ) chest discomfort, ( - ) lower extremity swelling Gastrointestinal:  ( - ) nausea, ( - ) heartburn, ( - ) change in bowel habits Skin: ( - ) abnormal skin rashes Lymphatics: ( - ) new lymphadenopathy, ( - ) easy bruising Neurological: ( - ) numbness, ( - ) tingling, ( - ) new  weaknesses Behavioral/Psych: ( - ) mood change, ( - ) new changes  All other systems were reviewed with the patient and are negative.  PHYSICAL EXAMINATION: ECOG PERFORMANCE STATUS: 0 - Asymptomatic  Vitals:   01/16/24  1039  BP: 137/79  Pulse: (!) 114  Resp: 18  Temp: (!) 97.1 F (36.2 C)  SpO2: 96%      Filed Weights   01/16/24 1039  Weight: 284 lb 8 oz (129 kg)       GENERAL: Well-appearing elderly male, alert, no distress and comfortable SKIN: skin color, texture, turgor are normal, no rashes or significant lesions EYES: conjunctiva are pink and non-injected, sclera clear LUNGS: clear to auscultation and percussion with normal breathing effort HEART: regular rate & rhythm and no murmurs and no lower extremity edema Musculoskeletal: no cyanosis of digits and no clubbing  PSYCH: alert & oriented x 3, fluent speech NEURO: no focal motor/sensory deficits  LABORATORY DATA:  I have reviewed the data as listed    Latest Ref Rng & Units 01/16/2024   10:05 AM 09/18/2023   10:43 AM 05/19/2023   10:32 AM  CBC  WBC 4.0 - 10.5 K/uL 8.2  9.0  8.1   Hemoglobin 13.0 - 17.0 g/dL 21.3  08.6  57.8   Hematocrit 39.0 - 52.0 % 44.4  41.0  41.4   Platelets 150 - 400 K/uL 226  274  209        Latest Ref Rng & Units 01/16/2024   10:05 AM 09/18/2023   10:43 AM 05/19/2023   10:32 AM  CMP  Glucose 70 - 99 mg/dL 469  629  528   BUN 8 - 23 mg/dL 14  15  17    Creatinine 0.61 - 1.24 mg/dL 4.13  2.44  0.10   Sodium 135 - 145 mmol/L 139  141  141   Potassium 3.5 - 5.1 mmol/L 4.2  4.1  3.7   Chloride 98 - 111 mmol/L 105  104  107   CO2 22 - 32 mmol/L 26  28  25    Calcium 8.9 - 10.3 mg/dL 9.8  27.2  9.8   Total Protein 6.5 - 8.1 g/dL 7.3  6.9  6.9   Total Bilirubin 0.0 - 1.2 mg/dL 0.5  0.3  0.4   Alkaline Phos 38 - 126 U/L 175  144  160   AST 15 - 41 U/L 12  12  11    ALT 0 - 44 U/L 16  20  17     RADIOGRAPHIC STUDIES: No results found.  ASSESSMENT & PLAN Terry Hinton 68 y.o. male  with medical history significant for castrate sensitive advanced prostate cancer who presents for a follow up visit.   # Castrate Sensitive Advanced Prostate Cancer -- Continue Eligard 30 mg every 4 months indefinitely. -- Continue Zytiga 1000 mg p.o. daily with prednisone 5 mg -- Labs today show white blood cell count 8.2, hemoglobin 14.4, MCV 86.7, platelets 226. Cr 0.89 --PSA <0.1 with testosterone less than 3 on 09/18/2023 -- Return to clinic in 4 months time for continued monitoring on Zytiga and continued Eligard shots.  No orders of the defined types were placed in this encounter.   All questions were answered. The patient knows to call the clinic with any problems, questions or concerns.  A total of more than 30 minutes were spent on this encounter with face-to-face time and non-face-to-face time, including preparing to see the patient, ordering tests and/or medications, counseling the patient and coordination of care as outlined above.   Ulysees Barns, MD Department of Hematology/Oncology [facility] at [facility] Phone: [phone] Pager: [phone] Email: [email or web address]  01/18/2024 2:01 PM

## 2024-01-17 LAB — PROSTATE-SPECIFIC AG, SERUM (LABCORP): Prostate Specific Ag, Serum: <0.1 ng/mL (ref 0.0–4.0)

## 2024-01-17 LAB — TESTOSTERONE: Testosterone: <3 ng/dL — ABNORMAL LOW (ref 264–916)

## 2024-01-18 ENCOUNTER — Encounter: Payer: Self-pay | Admitting: Hematology and Oncology

## 2024-01-19 ENCOUNTER — Telehealth: Payer: Self-pay | Admitting: *Deleted

## 2024-01-19 NOTE — Telephone Encounter (Signed)
 TCT patient regarding recent lab results.  Spoke with him. Advised that both his testosterone and PSA are undetectable. We will continue with his current treatment and plan to see him back in 3 months time. Pt voiced understanding and is aware of his appts in July 2025

## 2024-01-19 NOTE — Telephone Encounter (Signed)
-----   Message from Ulysees Barns IV sent at 01/18/2024  2:01 PM EDT ----- Please let [name] know that both his testosterone and PSA are undetectable.  We will continue with his current treatment and plan to see him back in 3 months time. ----- Message ----- From: Leory Plowman, Lab In [place] Sent: 01/16/2024  10:20 AM EDT To: Jaci Standard, MD

## 2024-01-21 ENCOUNTER — Other Ambulatory Visit: Payer: Self-pay | Admitting: Hematology and Oncology

## 2024-01-21 ENCOUNTER — Other Ambulatory Visit: Payer: Self-pay

## 2024-01-21 ENCOUNTER — Other Ambulatory Visit (HOSPITAL_COMMUNITY): Payer: Self-pay

## 2024-01-21 DIAGNOSIS — C61 Malignant neoplasm of prostate: Secondary | ICD-10-CM

## 2024-01-21 MED ORDER — ABIRATERONE ACETATE 250 MG PO TABS
ORAL_TABLET | Freq: Every day | ORAL | 0 refills | Status: DC
  Filled 2024-01-21: qty 120, 30d supply, fill #0

## 2024-01-21 NOTE — Progress Notes (Signed)
 Specialty Pharmacy Refill Coordination Note  Terry Hinton is a 68 y.o. male contacted today regarding refills of specialty medication(s) Abiraterone Acetate (ZYTIGA)   Patient requested Delivery   Delivery date: 01/26/24   Verified address: [address]   [place] [address]   Medication will be filled on 01/23/24.

## 2024-02-17 ENCOUNTER — Other Ambulatory Visit: Payer: Self-pay | Admitting: Hematology and Oncology

## 2024-02-17 ENCOUNTER — Other Ambulatory Visit: Payer: Self-pay

## 2024-02-17 ENCOUNTER — Other Ambulatory Visit (HOSPITAL_COMMUNITY): Payer: Self-pay

## 2024-02-17 ENCOUNTER — Other Ambulatory Visit (HOSPITAL_BASED_OUTPATIENT_CLINIC_OR_DEPARTMENT_OTHER): Payer: Self-pay

## 2024-02-17 DIAGNOSIS — C61 Malignant neoplasm of prostate: Secondary | ICD-10-CM

## 2024-02-17 MED ORDER — ABIRATERONE ACETATE 250 MG PO TABS
1000.0000 mg | ORAL_TABLET | Freq: Every day | ORAL | 11 refills | Status: AC
  Filled 2024-02-17: qty 120, 30d supply, fill #0
  Filled 2024-03-15: qty 120, 30d supply, fill #1
  Filled 2024-04-08: qty 120, 30d supply, fill #2
  Filled 2024-05-03 – 2024-05-05 (×2): qty 120, 30d supply, fill #3
  Filled 2024-05-31: qty 120, 30d supply, fill #4
  Filled 2024-06-25: qty 120, 30d supply, fill #5
  Filled 2024-07-23 – 2024-07-29 (×2): qty 120, 30d supply, fill #6
  Filled 2024-08-31: qty 120, 30d supply, fill #7
  Filled 2024-09-28: qty 120, 30d supply, fill #8
  Filled 2024-10-27: qty 120, 30d supply, fill #9
  Filled 2024-11-24: qty 120, 30d supply, fill #10

## 2024-02-17 NOTE — Progress Notes (Signed)
 Specialty Pharmacy Refill Coordination Note  Lorren Kielty is a 68 y.o. male contacted today regarding refills of specialty medication(s) Abiraterone Acetate (ZYTIGA)   Patient requested Delivery   Delivery date: 02/23/24   Verified address: [address]   [place] [address]   Medication will be filled on 02/20/24.

## 2024-02-20 ENCOUNTER — Other Ambulatory Visit: Payer: Self-pay

## 2024-03-15 ENCOUNTER — Other Ambulatory Visit: Payer: Self-pay

## 2024-03-15 NOTE — Progress Notes (Signed)
 Specialty Pharmacy Refill Coordination Note  Terry Hinton is a 68 y.o. male contacted today regarding refills of specialty medication(s) Abiraterone  Acetate (ZYTIGA )   Patient requested Delivery   Delivery date: 03/19/24   Verified address: [address]   [place]  [address]   Medication will be filled on 03/18/24.

## 2024-04-08 ENCOUNTER — Other Ambulatory Visit (HOSPITAL_COMMUNITY): Payer: Self-pay

## 2024-04-08 ENCOUNTER — Other Ambulatory Visit: Payer: Self-pay

## 2024-04-08 NOTE — Progress Notes (Signed)
 Specialty Pharmacy Ongoing Clinical Assessment Note  Terry Hinton is a 68 y.o. male who is being followed by the specialty pharmacy service for RxSp Oncology   Patient's specialty medication(s) reviewed today: Abiraterone  Acetate (ZYTIGA )   Missed doses in the last 4 weeks: 0   Patient/Caregiver did not have any additional questions or concerns.   Therapeutic benefit summary: Patient is achieving benefit   Adverse events/side effects summary: No adverse events/side effects   Patient's therapy is appropriate to: Continue    Goals Addressed             This Visit's Progress    Slow Disease Progression   On track    Patient is on track. Patient will maintain adherence and adhere to provider and/or lab appointments.  PSA remains <0.1 ng/mL.          Follow up: 6 months  Malachi Screws Specialty Pharmacist

## 2024-04-08 NOTE — Progress Notes (Signed)
 Specialty Pharmacy Refill Coordination Note  Terry Hinton is a 68 y.o. male contacted today regarding refills of specialty medication(s) Abiraterone  Acetate (ZYTIGA )   Patient requested Delivery   Delivery date: 04/13/24   Verified address: Patient address [address]  [place] [address]   Medication will be filled on 04/12/24.

## 2024-04-12 ENCOUNTER — Other Ambulatory Visit: Payer: Self-pay

## 2024-05-03 ENCOUNTER — Other Ambulatory Visit (HOSPITAL_COMMUNITY): Payer: Self-pay

## 2024-05-05 ENCOUNTER — Other Ambulatory Visit (HOSPITAL_COMMUNITY): Payer: Self-pay

## 2024-05-05 ENCOUNTER — Other Ambulatory Visit: Payer: Self-pay

## 2024-05-05 NOTE — Progress Notes (Signed)
 Specialty Pharmacy Refill Coordination Note  Terry Hinton is a 68 y.o. male contacted today regarding refills of specialty medication(s) Abiraterone  Acetate (ZYTIGA )   Patient requested Delivery   Delivery date: 05/11/24   Verified address: [address]  [place] [address]   Medication will be filled on 05/10/24.

## 2024-05-10 ENCOUNTER — Other Ambulatory Visit: Payer: Self-pay

## 2024-05-20 ENCOUNTER — Other Ambulatory Visit: Payer: Self-pay | Admitting: Physician Assistant

## 2024-05-20 DIAGNOSIS — C61 Malignant neoplasm of prostate: Secondary | ICD-10-CM

## 2024-05-21 ENCOUNTER — Inpatient Hospital Stay: Admitting: Physician Assistant

## 2024-05-21 ENCOUNTER — Encounter: Payer: Self-pay | Admitting: Hematology and Oncology

## 2024-05-21 ENCOUNTER — Inpatient Hospital Stay: Attending: Hematology and Oncology

## 2024-05-21 ENCOUNTER — Inpatient Hospital Stay

## 2024-05-21 VITALS — BP 123/83 | HR 101 | Temp 97.0°F | Resp 18 | Ht >= 80 in | Wt 272.0 lb

## 2024-05-21 VITALS — BP 119/73 | HR 97 | Temp 98.2°F | Resp 20

## 2024-05-21 DIAGNOSIS — Z79899 Other long term (current) drug therapy: Secondary | ICD-10-CM | POA: Insufficient documentation

## 2024-05-21 DIAGNOSIS — Z832 Family history of diseases of the blood and blood-forming organs and certain disorders involving the immune mechanism: Secondary | ICD-10-CM | POA: Insufficient documentation

## 2024-05-21 DIAGNOSIS — C61 Malignant neoplasm of prostate: Secondary | ICD-10-CM

## 2024-05-21 DIAGNOSIS — Z87891 Personal history of nicotine dependence: Secondary | ICD-10-CM | POA: Diagnosis not present

## 2024-05-21 DIAGNOSIS — Z803 Family history of malignant neoplasm of breast: Secondary | ICD-10-CM | POA: Diagnosis not present

## 2024-05-21 LAB — CBC WITH DIFFERENTIAL (CANCER CENTER ONLY)
Abs Immature Granulocytes: 0.05 K/uL (ref 0.00–0.07)
Basophils Absolute: 0.1 K/uL (ref 0.0–0.1)
Basophils Relative: 1 %
Eosinophils Absolute: 0.6 K/uL — ABNORMAL HIGH (ref 0.0–0.5)
Eosinophils Relative: 7 %
HCT: 42.7 % (ref 39.0–52.0)
Hemoglobin: 14 g/dL (ref 13.0–17.0)
Immature Granulocytes: 1 %
Lymphocytes Relative: 31 %
Lymphs Abs: 2.8 K/uL (ref 0.7–4.0)
MCH: 28.9 pg (ref 26.0–34.0)
MCHC: 32.8 g/dL (ref 30.0–36.0)
MCV: 88 fL (ref 80.0–100.0)
Monocytes Absolute: 0.7 K/uL (ref 0.1–1.0)
Monocytes Relative: 8 %
Neutro Abs: 4.8 K/uL (ref 1.7–7.7)
Neutrophils Relative %: 52 %
Platelet Count: 243 K/uL (ref 150–400)
RBC: 4.85 MIL/uL (ref 4.22–5.81)
RDW: 13 % (ref 11.5–15.5)
WBC Count: 9 K/uL (ref 4.0–10.5)
nRBC: 0 % (ref 0.0–0.2)

## 2024-05-21 LAB — CMP (CANCER CENTER ONLY)
ALT: 12 U/L (ref 0–44)
AST: 10 U/L — ABNORMAL LOW (ref 15–41)
Albumin: 4.6 g/dL (ref 3.5–5.0)
Alkaline Phosphatase: 130 U/L — ABNORMAL HIGH (ref 38–126)
Anion gap: 10 (ref 5–15)
BUN: 21 mg/dL (ref 8–23)
CO2: 25 mmol/L (ref 22–32)
Calcium: 9.9 mg/dL (ref 8.9–10.3)
Chloride: 106 mmol/L (ref 98–111)
Creatinine: 1.23 mg/dL (ref 0.61–1.24)
GFR, Estimated: >60 mL/min (ref >60)
Glucose, Bld: 114 mg/dL — ABNORMAL HIGH (ref 70–99)
Potassium: 3.8 mmol/L (ref 3.5–5.1)
Sodium: 141 mmol/L (ref 135–145)
Total Bilirubin: 0.5 mg/dL (ref 0.0–1.2)
Total Protein: 7.1 g/dL (ref 6.5–8.1)

## 2024-05-21 MED ORDER — LEUPROLIDE ACETATE (4 MONTH) 30 MG ~~LOC~~ KIT
30.0000 mg | PACK | Freq: Once | SUBCUTANEOUS | Status: AC
Start: 2024-05-21 — End: 2024-05-21
  Administered 2024-05-21: 30 mg via SUBCUTANEOUS
  Filled 2024-05-21: qty 30

## 2024-05-21 NOTE — Progress Notes (Signed)
 [facility] [phone]   Fax:(336) [phone]  PROGRESS NOTE  Patient Care Team: Estelle Carliss Sharper, MD (Inactive) as PCP - General (Internal Medicine)  Hematological/Oncological History # Castrate Sensitive Advanced Prostate Cancer 2015: firmagon 240 mg. Lost to follow up.  2019: initial PSA 2078, Gleason score was 4+5 = 10 January 2018: started Zytiga  1000 mg PO and Eligard  30 mg q 4 months 09/10/2022: last visit with Dr. Amadeo 01/16/2023: establish care with Dr. Federico   Interval History:  Terry Hinton 68 y.o. male with medical history significant for castrate sensitive advanced prostate cancer who presents for a follow up visit. The patient's last visit was on 01/16/2024. In the interim since the last visit he has continued on his Zytiga  and prednisone  therapy.  On exam today Terry Hinton reports he has had no major changes in his health in interim since her last visit.   He is compliant with taking his Zytiga  and prednisone  therapy every day.  He has been trying to lose weight with diet modifications.  He has lost approximately 12 pounds since the last visit.  He denies nausea, vomiting or bowel habit changes.  He denies easy bruising or signs of active bleeding.  He denies fevers, chills, night sweats, shortness of breath, chest pain or cough.  He has no other complaints. A full 10 point ROS is otherwise negative.   MEDICAL HISTORY:  Past Medical History:  Diagnosis Date   Allergy    Anemia    Arthritis    knee   Back pain    back, neck and leg problems, pt. stated   Enlarged prostate    pt. stated   Family history of breast cancer 06/21/2022   Hypertension    Leg injury    Prostate cancer (HCC)    Mets to Bone     SURGICAL HISTORY: Past Surgical History:  Procedure Laterality Date   NO PAST SURGERIES     PROSTATE BIOPSY      SOCIAL HISTORY: Social History   Socioeconomic History   Marital status: Widowed    Spouse name: Not on  file   Number of children: Not on file   Years of education: Not on file   Highest education level: Not on file  Occupational History   Not on file  Tobacco Use   Smoking status: Former    Current packs/day: 0.00    Types: Cigarettes    Quit date: 05/08/2014    Years since quitting: 10.0   Smokeless tobacco: Never  Substance and Sexual Activity   Alcohol use: Yes    Comment: beer occasionally    Drug use: No   Sexual activity: Not Currently  Other Topics Concern   Not on file  Social History Narrative   Not on file   Social Drivers of Health   Financial Resource Strain: Not on file  Food Insecurity: Not on file  Transportation Needs: Not on file  Physical Activity: Not on file  Stress: Not on file  Social Connections: Not on file  Intimate Partner Violence: Not on file    FAMILY HISTORY: Family History  Problem Relation Age of Onset   Breast cancer Mother        d. 63   Sickle cell anemia Sister        d. 53   Colon cancer Neg Hx    Colon polyps Neg Hx    Esophageal cancer Neg Hx    Rectal cancer Neg Hx  Stomach cancer Neg Hx     ALLERGIES:  has no known allergies.  MEDICATIONS:  Current Outpatient Medications  Medication Sig Dispense Refill   abiraterone  acetate (ZYTIGA ) 250 MG tablet TAKE 4 TABLETS (1,000 MG TOTAL) BY MOUTH DAILY. TAKE ON AN EMPTY STOMACH 1 HOUR BEFORE OR 2 HOURS AFTER A MEAL 120 tablet 11   blood glucose meter kit and supplies Dispense based on patient and insurance preference. Use up to four times daily as directed. (FOR ICD-10 E10.9, E11.9). 1 each 0   cholecalciferol (VITAMIN D) 1000 units tablet Take 1,000 Units by mouth daily.     cyclobenzaprine  (FLEXERIL ) 5 MG tablet Take 5 mg by mouth 3 (three) times daily as needed for muscle spasms.     finasteride  (PROSCAR ) 5 MG tablet Take 5 mg by mouth daily.     insulin  detemir (LEVEMIR ) 100 UNIT/ML injection Inject 0.3 mLs (30 Units total) into the skin 2 (two) times daily. 10 mL 11    INSULIN  SYRINGE .5CC/29G 29G X 1/2 0.5 ML MISC 0.5 mLs by Does not apply route 5 (five) times daily. 200 each 5   latanoprost  (XALATAN ) 0.005 % ophthalmic solution Place 1 drop into both eyes at bedtime.     lisinopril-hydrochlorothiazide (PRINZIDE,ZESTORETIC) 10-12.5 MG tablet Take 1 tablet by mouth daily.     OVER THE COUNTER MEDICATION Take 1 tablet by mouth daily.     potassium chloride  SA (K-DUR,KLOR-CON ) 20 MEQ tablet Take 1 tablet (20 mEq total) by mouth daily. 30 tablet 0   predniSONE  (DELTASONE ) 5 MG tablet Take 1 tablet (5 mg total) by mouth daily with breakfast. 90 tablet 3   tamsulosin  (FLOMAX ) 0.4 MG CAPS capsule Take 0.4 mg by mouth daily.   3   traMADol  (ULTRAM ) 50 MG tablet Take 50 mg by mouth every 6 (six) hours as needed (Pain).     insulin  regular (NOVOLIN R) 100 units/mL injection For glucose 121 to 150 use two units, for 151 to 200 use four units, for 201 to 250 use six units, for 251 to 300 use ten units and for 301 to 350 use twelve units, for 351 or greater use fourteen units. 10 mL 3   No current facility-administered medications for this visit.    REVIEW OF SYSTEMS:   Constitutional: ( - ) fevers, ( - )  chills , ( - ) night sweats Eyes: ( - ) blurriness of vision, ( - ) double vision, ( - ) watery eyes Ears, nose, mouth, throat, and face: ( - ) mucositis, ( - ) sore throat Respiratory: ( - ) cough, ( - ) dyspnea, ( - ) wheezes Cardiovascular: ( - ) palpitation, ( - ) chest discomfort, ( - ) lower extremity swelling Gastrointestinal:  ( - ) nausea, ( - ) heartburn, ( - ) change in bowel habits Skin: ( - ) abnormal skin rashes Lymphatics: ( - ) new lymphadenopathy, ( - ) easy bruising Neurological: ( - ) numbness, ( - ) tingling, ( - ) new weaknesses Behavioral/Psych: ( - ) mood change, ( - ) new changes  All other systems were reviewed with the patient and are negative.  PHYSICAL EXAMINATION: ECOG PERFORMANCE STATUS: 0 - Asymptomatic  Vitals:   05/21/24 1132   BP: 123/83  Pulse: (!) 101  Resp: 18  Temp: (!) 97 F (36.1 C)  SpO2: 94%      Filed Weights   05/21/24 1132  Weight: 272 lb (123.4 kg)     GENERAL:  Well-appearing elderly male, alert, no distress and comfortable SKIN: skin color, texture, turgor are normal, no rashes or significant lesions EYES: conjunctiva are pink and non-injected, sclera clear LUNGS: clear to auscultation and percussion with normal breathing effort HEART: regular rate & rhythm and no murmurs and no lower extremity edema Musculoskeletal: no cyanosis of digits and no clubbing  PSYCH: alert & oriented x 3, fluent speech NEURO: no focal motor/sensory deficits  LABORATORY DATA:  I have reviewed the data as listed    Latest Ref Rng & Units 05/21/2024   11:20 AM 01/16/2024   10:05 AM 09/18/2023   10:43 AM  CBC  WBC 4.0 - 10.5 K/uL 9.0  8.2  9.0   Hemoglobin 13.0 - 17.0 g/dL 85.9  85.5  85.9   Hematocrit 39.0 - 52.0 % 42.7  44.4  41.0   Platelets 150 - 400 K/uL 243  226  274        Latest Ref Rng & Units 05/21/2024   11:20 AM 01/16/2024   10:05 AM 09/18/2023   10:43 AM  CMP  Glucose 70 - 99 mg/dL 885  834  875   BUN 8 - 23 mg/dL 21  14  15    Creatinine 0.61 - 1.24 mg/dL 8.76  9.10  8.98   Sodium 135 - 145 mmol/L 141  139  141   Potassium 3.5 - 5.1 mmol/L 3.8  4.2  4.1   Chloride 98 - 111 mmol/L 106  105  104   CO2 22 - 32 mmol/L 25  26  28    Calcium 8.9 - 10.3 mg/dL 9.9  9.8  89.9   Total Protein 6.5 - 8.1 g/dL 7.1  7.3  6.9   Total Bilirubin 0.0 - 1.2 mg/dL 0.5  0.5  0.3   Alkaline Phos 38 - 126 U/L 130  175  144   AST 15 - 41 U/L 10  12  12    ALT 0 - 44 U/L 12  16  20     RADIOGRAPHIC STUDIES: No results found.  ASSESSMENT & PLAN Terry Hinton 68 y.o. male with medical history significant for castrate sensitive advanced prostate cancer who presents for a follow up visit.   # Castrate Sensitive Advanced Prostate Cancer -- Continue Eligard  30 mg every 4 months indefinitely. Due today.  --  Continue Zytiga  1000 mg p.o. daily with prednisone  5 mg -- Labs today show white blood cell count 9.0, hemoglobin 14.0, MCV 88.0, platelets 243. Cr 1.23 --PSA <0.1 with testosterone  less than 3 on 01/16/2024. Today's levels are pending -- Return to clinic in 4 months time for continued monitoring on Zytiga  and continued Eligard  shots.  No orders of the defined types were placed in this encounter.   All questions were answered. The patient knows to call the clinic with any problems, questions or concerns.  A total of more than 30 minutes were spent on this encounter with face-to-face time and non-face-to-face time, including preparing to see the patient, ordering tests and/or medications, counseling the patient and coordination of care as outlined above.   Johnston Police PA-C Dept of Hematology and Oncology [facility] at [facility] Phone: [phone]   05/21/2024 12:11 PM

## 2024-05-22 LAB — PROSTATE-SPECIFIC AG, SERUM (LABCORP): Prostate Specific Ag, Serum: <0.1 ng/mL (ref 0.0–4.0)

## 2024-05-22 LAB — TESTOSTERONE: Testosterone: <3 ng/dL — ABNORMAL LOW (ref 264–916)

## 2024-05-24 ENCOUNTER — Ambulatory Visit: Payer: Self-pay

## 2024-05-24 NOTE — Telephone Encounter (Signed)
-----   Message from [place] Police sent at 05/24/2024  9:24 AM EDT ----- Please notify patient that PSA levels are normal.  ----- Message ----- From: Interface, Lab In [place] Sent: 05/21/2024  11:33 AM EDT To: [place] Police, PA-C

## 2024-05-24 NOTE — Telephone Encounter (Signed)
 Pt advised and appreciative of the call

## 2024-05-31 ENCOUNTER — Other Ambulatory Visit (HOSPITAL_COMMUNITY): Payer: Self-pay

## 2024-05-31 ENCOUNTER — Other Ambulatory Visit: Payer: Self-pay

## 2024-05-31 NOTE — Progress Notes (Signed)
 Specialty Pharmacy Refill Coordination Note  Spoke with Terry Hinton  Terry Hinton is a 68 y.o. male contacted today regarding refills of specialty medication(s) Abiraterone  Acetate (ZYTIGA )  Doses on hand: 11  Patient requested: Delivery   Delivery date: 06/03/24   Verified address: [address] [place] [address]  Medication will be filled on 06/02/24.

## 2024-06-01 ENCOUNTER — Other Ambulatory Visit: Payer: Self-pay

## 2024-06-25 ENCOUNTER — Other Ambulatory Visit: Payer: Self-pay

## 2024-06-25 NOTE — Progress Notes (Signed)
 Specialty Pharmacy Refill Coordination Note  Terry Hinton is a 68 y.o. male contacted today regarding refills of specialty medication(s) Abiraterone  Acetate (ZYTIGA )   Patient requested Delivery   Delivery date: 07/02/24   Verified address: [address] [place] [address]   Medication will be filled on 07/01/24.

## 2024-07-23 ENCOUNTER — Other Ambulatory Visit: Payer: Self-pay

## 2024-07-29 ENCOUNTER — Other Ambulatory Visit: Payer: Self-pay

## 2024-07-29 NOTE — Progress Notes (Signed)
 Specialty Pharmacy Refill Coordination Note  Terry Hinton is a 68 y.o. male contacted today regarding refills of specialty medication(s) Abiraterone  Acetate (ZYTIGA )   Patient requested Delivery   Delivery date: 08/04/24   Verified address: [address] [place] [address]   Medication will be filled on 09.29.25.

## 2024-08-03 ENCOUNTER — Other Ambulatory Visit: Payer: Self-pay

## 2024-08-26 ENCOUNTER — Other Ambulatory Visit (HOSPITAL_COMMUNITY): Payer: Self-pay

## 2024-08-31 ENCOUNTER — Other Ambulatory Visit (HOSPITAL_COMMUNITY): Payer: Self-pay

## 2024-08-31 ENCOUNTER — Other Ambulatory Visit: Payer: Self-pay

## 2024-08-31 NOTE — Progress Notes (Signed)
 Specialty Pharmacy Refill Coordination Note  Terry Hinton is a 68 y.o. male contacted today regarding refills of specialty medication(s) Abiraterone  Acetate (ZYTIGA )   Patient requested Delivery   Delivery date: 09/02/24   Verified address: [address] [place] [address]   Medication will be filled on: 09/01/24

## 2024-09-17 ENCOUNTER — Inpatient Hospital Stay

## 2024-09-17 ENCOUNTER — Inpatient Hospital Stay: Admitting: Hematology and Oncology

## 2024-09-17 ENCOUNTER — Inpatient Hospital Stay: Attending: Hematology and Oncology

## 2024-09-17 ENCOUNTER — Other Ambulatory Visit: Payer: Self-pay | Admitting: Hematology and Oncology

## 2024-09-17 VITALS — BP 130/93 | HR 107 | Temp 97.0°F | Resp 16 | Wt 278.1 lb

## 2024-09-17 DIAGNOSIS — Z79899 Other long term (current) drug therapy: Secondary | ICD-10-CM | POA: Insufficient documentation

## 2024-09-17 DIAGNOSIS — C61 Malignant neoplasm of prostate: Secondary | ICD-10-CM

## 2024-09-17 DIAGNOSIS — Z832 Family history of diseases of the blood and blood-forming organs and certain disorders involving the immune mechanism: Secondary | ICD-10-CM | POA: Insufficient documentation

## 2024-09-17 DIAGNOSIS — Z803 Family history of malignant neoplasm of breast: Secondary | ICD-10-CM | POA: Insufficient documentation

## 2024-09-17 DIAGNOSIS — Z87891 Personal history of nicotine dependence: Secondary | ICD-10-CM | POA: Insufficient documentation

## 2024-09-17 DIAGNOSIS — Z7952 Long term (current) use of systemic steroids: Secondary | ICD-10-CM | POA: Insufficient documentation

## 2024-09-17 LAB — CBC WITH DIFFERENTIAL (CANCER CENTER ONLY)
Abs Immature Granulocytes: 0.04 K/uL (ref 0.00–0.07)
Basophils Absolute: 0 K/uL (ref 0.0–0.1)
Basophils Relative: 1 %
Eosinophils Absolute: 0.7 K/uL — ABNORMAL HIGH (ref 0.0–0.5)
Eosinophils Relative: 8 %
HCT: 43.4 % (ref 39.0–52.0)
Hemoglobin: 14.7 g/dL (ref 13.0–17.0)
Immature Granulocytes: 1 %
Lymphocytes Relative: 27 %
Lymphs Abs: 2.2 K/uL (ref 0.7–4.0)
MCH: 28.8 pg (ref 26.0–34.0)
MCHC: 33.9 g/dL (ref 30.0–36.0)
MCV: 84.9 fL (ref 80.0–100.0)
Monocytes Absolute: 0.6 K/uL (ref 0.1–1.0)
Monocytes Relative: 8 %
Neutro Abs: 4.7 K/uL (ref 1.7–7.7)
Neutrophils Relative %: 55 %
Platelet Count: 224 K/uL (ref 150–400)
RBC: 5.11 MIL/uL (ref 4.22–5.81)
RDW: 13.2 % (ref 11.5–15.5)
WBC Count: 8.3 K/uL (ref 4.0–10.5)
nRBC: 0 % (ref 0.0–0.2)

## 2024-09-17 LAB — CMP (CANCER CENTER ONLY)
ALT: 15 U/L (ref 0–44)
AST: 10 U/L — ABNORMAL LOW (ref 15–41)
Albumin: 4.7 g/dL (ref 3.5–5.0)
Alkaline Phosphatase: 158 U/L — ABNORMAL HIGH (ref 38–126)
Anion gap: 11 (ref 5–15)
BUN: 16 mg/dL (ref 8–23)
CO2: 22 mmol/L (ref 22–32)
Calcium: 10 mg/dL (ref 8.9–10.3)
Chloride: 107 mmol/L (ref 98–111)
Creatinine: 0.95 mg/dL (ref 0.61–1.24)
GFR, Estimated: >60 mL/min (ref >60)
Glucose, Bld: 166 mg/dL — ABNORMAL HIGH (ref 70–99)
Potassium: 3.7 mmol/L (ref 3.5–5.1)
Sodium: 140 mmol/L (ref 135–145)
Total Bilirubin: 0.4 mg/dL (ref 0.0–1.2)
Total Protein: 7 g/dL (ref 6.5–8.1)

## 2024-09-17 LAB — PSA: Prostatic Specific Antigen: <0.02 ng/mL (ref 0.00–4.00)

## 2024-09-17 MED ORDER — LEUPROLIDE ACETATE (4 MONTH) 30 MG ~~LOC~~ KIT
30.0000 mg | PACK | Freq: Once | SUBCUTANEOUS | Status: AC
  Administered 2024-09-17: 30 mg via SUBCUTANEOUS
  Filled 2024-09-17: qty 30

## 2024-09-17 NOTE — Progress Notes (Signed)
 [facility] [phone]   [phone]  PROGRESS NOTE  Patient Care Team: Estelle Carliss Sharper, MD (Inactive) as PCP - General (Internal Medicine)  Hematological/Oncological History # Castrate Sensitive Advanced Prostate Cancer 2015: firmagon 240 mg. Lost to follow up.  2019: initial PSA 2078, Gleason score was 4+5 = 10 January 2018: started Zytiga  1000 mg PO and Eligard  30 mg q 4 months 09/10/2022: last visit with Dr. Amadeo 01/16/2023: establish care with Dr. Federico   Interval History:  Terry Hinton 68 y.o. male with medical history significant for castrate sensitive advanced prostate cancer who presents for a follow up visit. The patient's last visit was on 05/21/2024. In the interim since the last visit he has continued on his Zytiga  and prednisone  therapy.  On exam today Terry Hinton reports he has been well overall in the room since her last visit 4 months ago.  He reports his energy levels are quite good.  He reports he did lose some weight but unfortunately gained it right back.  He reports he was down to 272 pounds, but is now up to 278 pounds today.  He reports that he is doing his best to try to avoid sweets due to his type 2 diabetes.  He notes he send no recent issues with runny nose, sore throat, cough.  Denies any fevers, chills, sweats.  He reports his energy levels are good and he recently got his flu shot.  He reports his appetite remains strong.  He is not having any difficulty with his Zytiga  or the Lupron  shots.  He reports is not having any hot flashes, sweats, or redness or itching at the injection site.  Overall he feels well and has no additional questions concerns or complaints today.  A full 10 point ROS is otherwise negative.  MEDICAL HISTORY:  Past Medical History:  Diagnosis Date   Allergy    Anemia    Arthritis    knee   Back pain    back, neck and leg problems, pt. stated   Enlarged prostate    pt. stated   Family  history of breast cancer 06/21/2022   Hypertension    Leg injury    Prostate cancer (HCC)    Mets to Bone     SURGICAL HISTORY: Past Surgical History:  Procedure Laterality Date   NO PAST SURGERIES     PROSTATE BIOPSY      SOCIAL HISTORY: Social History   Socioeconomic History   Marital status: Widowed    Spouse name: Not on file   Number of children: Not on file   Years of education: Not on file   Highest education level: Not on file  Occupational History   Not on file  Tobacco Use   Smoking status: Former    Current packs/day: 0.00    Types: Cigarettes    Quit date: 05/08/2014    Years since quitting: 10.3   Smokeless tobacco: Never  Substance and Sexual Activity   Alcohol use: Yes    Comment: beer occasionally    Drug use: No   Sexual activity: Not Currently  Other Topics Concern   Not on file  Social History Narrative   Not on file   Social Drivers of Health   Financial Resource Strain: Not on file  Food Insecurity: Not on file  Transportation Needs: Not on file  Physical Activity: Not on file  Stress: Not on file  Social Connections: Not on file  Intimate Partner  Violence: Not on file    FAMILY HISTORY: Family History  Problem Relation Age of Onset   Breast cancer Mother        d. 49   Sickle cell anemia Sister        d. 54   Colon cancer Neg Hx    Colon polyps Neg Hx    Esophageal cancer Neg Hx    Rectal cancer Neg Hx    Stomach cancer Neg Hx     ALLERGIES:  has no known allergies.  MEDICATIONS:  Current Outpatient Medications  Medication Sig Dispense Refill   abiraterone  acetate (ZYTIGA ) 250 MG tablet TAKE 4 TABLETS (1,000 MG TOTAL) BY MOUTH DAILY. TAKE ON AN EMPTY STOMACH 1 HOUR BEFORE OR 2 HOURS AFTER A MEAL 120 tablet 11   blood glucose meter kit and supplies Dispense based on patient and insurance preference. Use up to four times daily as directed. (FOR ICD-10 E10.9, E11.9). 1 each 0   cholecalciferol (VITAMIN D) 1000 units tablet Take  1,000 Units by mouth daily.     cyclobenzaprine  (FLEXERIL ) 5 MG tablet Take 5 mg by mouth 3 (three) times daily as needed for muscle spasms.     finasteride  (PROSCAR ) 5 MG tablet Take 5 mg by mouth daily.     insulin  detemir (LEVEMIR ) 100 UNIT/ML injection Inject 0.3 mLs (30 Units total) into the skin 2 (two) times daily. 10 mL 11   insulin  regular (NOVOLIN R) 100 units/mL injection For glucose 121 to 150 use two units, for 151 to 200 use four units, for 201 to 250 use six units, for 251 to 300 use ten units and for 301 to 350 use twelve units, for 351 or greater use fourteen units. 10 mL 3   INSULIN  SYRINGE .5CC/29G 29G X 1/2 0.5 ML MISC 0.5 mLs by Does not apply route 5 (five) times daily. 200 each 5   latanoprost  (XALATAN ) 0.005 % ophthalmic solution Place 1 drop into both eyes at bedtime.     lisinopril-hydrochlorothiazide (PRINZIDE,ZESTORETIC) 10-12.5 MG tablet Take 1 tablet by mouth daily.     OVER THE COUNTER MEDICATION Take 1 tablet by mouth daily.     potassium chloride  SA (K-DUR,KLOR-CON ) 20 MEQ tablet Take 1 tablet (20 mEq total) by mouth daily. 30 tablet 0   predniSONE  (DELTASONE ) 5 MG tablet Take 1 tablet (5 mg total) by mouth daily with breakfast. 90 tablet 3   tamsulosin  (FLOMAX ) 0.4 MG CAPS capsule Take 0.4 mg by mouth daily.   3   traMADol  (ULTRAM ) 50 MG tablet Take 50 mg by mouth every 6 (six) hours as needed (Pain).     No current facility-administered medications for this visit.    REVIEW OF SYSTEMS:   Constitutional: ( - ) fevers, ( - )  chills , ( - ) night sweats Eyes: ( - ) blurriness of vision, ( - ) double vision, ( - ) watery eyes Ears, nose, mouth, throat, and face: ( - ) mucositis, ( - ) sore throat Respiratory: ( - ) cough, ( - ) dyspnea, ( - ) wheezes Cardiovascular: ( - ) palpitation, ( - ) chest discomfort, ( - ) lower extremity swelling Gastrointestinal:  ( - ) nausea, ( - ) heartburn, ( - ) change in bowel habits Skin: ( - ) abnormal skin  rashes Lymphatics: ( - ) new lymphadenopathy, ( - ) easy bruising Neurological: ( - ) numbness, ( - ) tingling, ( - ) new weaknesses Behavioral/Psych: ( - )  mood change, ( - ) new changes  All other systems were reviewed with the patient and are negative.  PHYSICAL EXAMINATION: ECOG PERFORMANCE STATUS: 0 - Asymptomatic  Vitals:   09/17/24 1130  BP: (!) 130/93  Pulse: (!) 107  Resp: 16  Temp: (!) 97 F (36.1 C)  SpO2: 98%       Filed Weights   09/17/24 1130  Weight: 278 lb 1.6 oz (126.1 kg)      GENERAL: Well-appearing elderly male, alert, no distress and comfortable SKIN: skin color, texture, turgor are normal, no rashes or significant lesions EYES: conjunctiva are pink and non-injected, sclera clear LUNGS: clear to auscultation and percussion with normal breathing effort HEART: regular rate & rhythm and no murmurs and no lower extremity edema Musculoskeletal: no cyanosis of digits and no clubbing  PSYCH: alert & oriented x 3, fluent speech NEURO: no focal motor/sensory deficits  LABORATORY DATA:  I have reviewed the data as listed    Latest Ref Rng & Units 09/17/2024   10:38 AM 05/21/2024   11:20 AM 01/16/2024   10:05 AM  CBC  WBC 4.0 - 10.5 K/uL 8.3  9.0  8.2   Hemoglobin 13.0 - 17.0 g/dL 85.2  85.9  85.5   Hematocrit 39.0 - 52.0 % 43.4  42.7  44.4   Platelets 150 - 400 K/uL 224  243  226        Latest Ref Rng & Units 09/17/2024   10:38 AM 05/21/2024   11:20 AM 01/16/2024   10:05 AM  CMP  Glucose 70 - 99 mg/dL 833  885  834   BUN 8 - 23 mg/dL 16  21  14    Creatinine 0.61 - 1.24 mg/dL 9.04  8.76  9.10   Sodium 135 - 145 mmol/L 140  141  139   Potassium 3.5 - 5.1 mmol/L 3.7  3.8  4.2   Chloride 98 - 111 mmol/L 107  106  105   CO2 22 - 32 mmol/L 22  25  26    Calcium 8.9 - 10.3 mg/dL 89.9  9.9  9.8   Total Protein 6.5 - 8.1 g/dL 7.0  7.1  7.3   Total Bilirubin 0.0 - 1.2 mg/dL 0.4  0.5  0.5   Alkaline Phos 38 - 126 U/L 158  130  175   AST 15 - 41 U/L 10   10  12    ALT 0 - 44 U/L 15  12  16     RADIOGRAPHIC STUDIES: No results found.  ASSESSMENT & PLAN Terry Hinton 68 y.o. male with medical history significant for castrate sensitive advanced prostate cancer who presents for a follow up visit.   # Castrate Sensitive Advanced Prostate Cancer -- Continue Eligard  30 mg every 4 months indefinitely. Due today.  -- Continue Zytiga  1000 mg p.o. daily with prednisone  5 mg -- Labs today show white blood cell count 8.3, Hgb 14.7, MCV 84.9, Plt 224. Cr 0.95 --PSA <0.1 with testosterone  less than 3 on 01/16/2024. Today's levels are pending -- Return to clinic in 4 months time for continued monitoring on Zytiga  and continued Eligard  shots.  No orders of the defined types were placed in this encounter.   All questions were answered. The patient knows to call the clinic with any problems, questions or concerns.  A total of more than 30 minutes were spent on this encounter with face-to-face time and non-face-to-face time, including preparing to see the patient, ordering tests and/or medications, counseling the patient  and coordination of care as outlined above.   Norleen IVAR Kidney, MD Department of Hematology/Oncology [facility] Phone: [phone] Pager: [phone] Email: [email or web address]    09/17/2024 4:22 PM

## 2024-09-18 LAB — TESTOSTERONE: Testosterone: <3 ng/dL — ABNORMAL LOW (ref 264–916)

## 2024-09-23 ENCOUNTER — Other Ambulatory Visit (HOSPITAL_COMMUNITY): Payer: Self-pay

## 2024-09-27 ENCOUNTER — Other Ambulatory Visit (HOSPITAL_COMMUNITY): Payer: Self-pay

## 2024-09-28 ENCOUNTER — Other Ambulatory Visit: Payer: Self-pay

## 2024-09-28 NOTE — Progress Notes (Signed)
 Specialty Pharmacy Ongoing Clinical Assessment Note  Terry Hinton is a 68 y.o. male who is being followed by the specialty pharmacy service for RxSp Oncology   Patient's specialty medication(s) reviewed today: Abiraterone  Acetate (ZYTIGA )   Missed doses in the last 4 weeks: 0   Patient/Caregiver did not have any additional questions or concerns.   Therapeutic benefit summary: Patient is achieving benefit   Adverse events/side effects summary: No adverse events/side effects   Patient's therapy is appropriate to: Continue    Goals Addressed             This Visit's Progress    Slow Disease Progression   On track    Patient is on track. Patient will maintain adherence and adhere to provider and/or lab appointments.  PSA remains <0.1 ng/mL.          Follow up: 6 months  Silvano LOISE Dolly Specialty Pharmacist

## 2024-09-28 NOTE — Progress Notes (Signed)
 Specialty Pharmacy Refill Coordination Note  Terry Hinton is a 68 y.o. male contacted today regarding refills of specialty medication(s) Abiraterone  Acetate (ZYTIGA )   Patient requested Delivery   Delivery date: 10/01/24   Verified address: [address] [place] [address]   Medication will be filled on: 09/29/24

## 2024-10-22 ENCOUNTER — Other Ambulatory Visit (HOSPITAL_COMMUNITY): Payer: Self-pay

## 2024-10-27 ENCOUNTER — Other Ambulatory Visit (HOSPITAL_COMMUNITY): Payer: Self-pay

## 2024-10-29 ENCOUNTER — Other Ambulatory Visit: Payer: Self-pay | Admitting: Pharmacy Technician

## 2024-10-29 ENCOUNTER — Other Ambulatory Visit: Payer: Self-pay

## 2024-10-29 NOTE — Progress Notes (Signed)
 Specialty Pharmacy Refill Coordination Note  Terry Hinton is a 68 y.o. male contacted today regarding refills of specialty medication(s) Abiraterone  Acetate (ZYTIGA )   Patient requested Delivery   Delivery date: 11/02/24   Verified address: [address]  [place]   Medication will be filled on: 11/01/24

## 2024-11-01 ENCOUNTER — Other Ambulatory Visit: Payer: Self-pay

## 2024-11-24 ENCOUNTER — Other Ambulatory Visit: Payer: Self-pay | Admitting: Pharmacy Technician

## 2024-11-24 ENCOUNTER — Other Ambulatory Visit: Payer: Self-pay

## 2024-11-24 NOTE — Progress Notes (Signed)
 Specialty Pharmacy Refill Coordination Note  Terry Hinton is a 69 y.o. male contacted today regarding refills of specialty medication(s) Abiraterone  Acetate (ZYTIGA )   Patient requested Delivery   Delivery date: 11/26/24   Verified address: [address] [place]   Medication will be filled on: 11/25/24

## 2024-12-08 ENCOUNTER — Other Ambulatory Visit: Payer: Self-pay

## 2024-12-08 NOTE — Progress Notes (Signed)
 Patient sent money order to [facility] to pay positive balance towards [facility] account for future fills. Due to upcoming changes to pharmacy billing, unable to accept money order at this time. Rosina Batter sent money order back to patient with explanation enclosed on 2.4.26.

## 2025-01-14 ENCOUNTER — Inpatient Hospital Stay: Attending: Hematology and Oncology

## 2025-01-14 ENCOUNTER — Inpatient Hospital Stay: Admitting: Hematology and Oncology

## 2025-01-14 ENCOUNTER — Inpatient Hospital Stay
